# Patient Record
Sex: Male | Born: 1960 | Race: White | Hispanic: No | Marital: Married | Smoking: Never smoker
Health system: Southern US, Community
[De-identification: ages and names within clinical notes are randomized; demographics above are authoritative.]

## PROBLEM LIST (undated history)

## (undated) DIAGNOSIS — J189 Pneumonia, unspecified organism: Secondary | ICD-10-CM

## (undated) DIAGNOSIS — M1990 Unspecified osteoarthritis, unspecified site: Secondary | ICD-10-CM

## (undated) DIAGNOSIS — U071 COVID-19: Secondary | ICD-10-CM

## (undated) DIAGNOSIS — G4730 Sleep apnea, unspecified: Secondary | ICD-10-CM

## (undated) DIAGNOSIS — I700 Atherosclerosis of aorta: Secondary | ICD-10-CM

## (undated) DIAGNOSIS — I444 Left anterior fascicular block: Secondary | ICD-10-CM

## (undated) DIAGNOSIS — R0600 Dyspnea, unspecified: Secondary | ICD-10-CM

## (undated) DIAGNOSIS — I2510 Atherosclerotic heart disease of native coronary artery without angina pectoris: Secondary | ICD-10-CM

## (undated) DIAGNOSIS — F909 Attention-deficit hyperactivity disorder, unspecified type: Secondary | ICD-10-CM

## (undated) DIAGNOSIS — I219 Acute myocardial infarction, unspecified: Secondary | ICD-10-CM

## (undated) DIAGNOSIS — E785 Hyperlipidemia, unspecified: Secondary | ICD-10-CM

## (undated) DIAGNOSIS — I10 Essential (primary) hypertension: Secondary | ICD-10-CM

## (undated) DIAGNOSIS — Z9889 Other specified postprocedural states: Secondary | ICD-10-CM

## (undated) DIAGNOSIS — I209 Angina pectoris, unspecified: Secondary | ICD-10-CM

## (undated) DIAGNOSIS — H9190 Unspecified hearing loss, unspecified ear: Secondary | ICD-10-CM

## (undated) DIAGNOSIS — R7303 Prediabetes: Secondary | ICD-10-CM

## (undated) DIAGNOSIS — I25810 Atherosclerosis of coronary artery bypass graft(s) without angina pectoris: Secondary | ICD-10-CM

## (undated) DIAGNOSIS — M1711 Unilateral primary osteoarthritis, right knee: Secondary | ICD-10-CM

## (undated) DIAGNOSIS — Z951 Presence of aortocoronary bypass graft: Secondary | ICD-10-CM

## (undated) DIAGNOSIS — Z7982 Long term (current) use of aspirin: Secondary | ICD-10-CM

## (undated) HISTORY — PX: CARDIAC SURGERY: SHX584

## (undated) HISTORY — PX: ARTHROSCOPIC REPAIR ACL: SUR80

## (undated) HISTORY — PX: CORONARY ANGIOPLASTY WITH STENT PLACEMENT: SHX49

## (undated) HISTORY — PX: COLONOSCOPY W/ POLYPECTOMY: SHX1380

---

## 1999-02-14 DIAGNOSIS — I2510 Atherosclerotic heart disease of native coronary artery without angina pectoris: Secondary | ICD-10-CM

## 1999-02-14 DIAGNOSIS — Z951 Presence of aortocoronary bypass graft: Secondary | ICD-10-CM

## 1999-02-14 HISTORY — PX: CORONARY ARTERY BYPASS GRAFT: SHX141

## 1999-02-14 HISTORY — DX: Atherosclerotic heart disease of native coronary artery without angina pectoris: I25.10

## 1999-02-14 HISTORY — DX: Presence of aortocoronary bypass graft: Z95.1

## 2017-03-09 ENCOUNTER — Encounter: Payer: Self-pay | Admitting: Emergency Medicine

## 2017-03-09 ENCOUNTER — Other Ambulatory Visit: Payer: Self-pay

## 2017-03-09 ENCOUNTER — Emergency Department
Admission: EM | Admit: 2017-03-09 | Discharge: 2017-03-09 | Disposition: A | Discharge status: Home / Self Care | Payer: No Typology Code available for payment source | Attending: Emergency Medicine | Admitting: Emergency Medicine

## 2017-03-09 ENCOUNTER — Emergency Department: Payer: No Typology Code available for payment source

## 2017-03-09 DIAGNOSIS — I2510 Atherosclerotic heart disease of native coronary artery without angina pectoris: Secondary | ICD-10-CM | POA: Insufficient documentation

## 2017-03-09 DIAGNOSIS — I10 Essential (primary) hypertension: Secondary | ICD-10-CM | POA: Insufficient documentation

## 2017-03-09 DIAGNOSIS — K5790 Diverticulosis of intestine, part unspecified, without perforation or abscess without bleeding: Secondary | ICD-10-CM | POA: Diagnosis not present

## 2017-03-09 DIAGNOSIS — K5792 Diverticulitis of intestine, part unspecified, without perforation or abscess without bleeding: Secondary | ICD-10-CM

## 2017-03-09 DIAGNOSIS — R1032 Left lower quadrant pain: Secondary | ICD-10-CM | POA: Diagnosis present

## 2017-03-09 HISTORY — DX: Atherosclerotic heart disease of native coronary artery without angina pectoris: I25.10

## 2017-03-09 HISTORY — DX: Essential (primary) hypertension: I10

## 2017-03-09 LAB — URINALYSIS, COMPLETE (UACMP) WITH MICROSCOPIC
BILIRUBIN URINE: NEGATIVE
Bacteria, UA: NONE SEEN
Glucose, UA: NEGATIVE mg/dL
Hgb urine dipstick: NEGATIVE
KETONES UR: 80 mg/dL — AB
Leukocytes, UA: NEGATIVE
Nitrite: NEGATIVE
PH: 5 (ref 5.0–8.0)
PROTEIN: 30 mg/dL — AB
SQUAMOUS EPITHELIAL / LPF: NONE SEEN
Specific Gravity, Urine: 1.017 (ref 1.005–1.030)

## 2017-03-09 LAB — CBC
HCT: 42.6 % (ref 40.0–52.0)
HEMOGLOBIN: 14.8 g/dL (ref 13.0–18.0)
MCH: 31.2 pg (ref 26.0–34.0)
MCHC: 34.9 g/dL (ref 32.0–36.0)
MCV: 89.5 fL (ref 80.0–100.0)
Platelets: 262 10*3/uL (ref 150–440)
RBC: 4.76 MIL/uL (ref 4.40–5.90)
RDW: 13 % (ref 11.5–14.5)
WBC: 7.2 10*3/uL (ref 3.8–10.6)

## 2017-03-09 LAB — COMPREHENSIVE METABOLIC PANEL
ALBUMIN: 4.6 g/dL (ref 3.5–5.0)
ALK PHOS: 26 U/L — AB (ref 38–126)
ALT: 36 U/L (ref 17–63)
ANION GAP: 14 (ref 5–15)
AST: 25 U/L (ref 15–41)
BUN: 11 mg/dL (ref 6–20)
CALCIUM: 9.9 mg/dL (ref 8.9–10.3)
CHLORIDE: 99 mmol/L — AB (ref 101–111)
CO2: 24 mmol/L (ref 22–32)
Creatinine, Ser: 0.97 mg/dL (ref 0.61–1.24)
GFR calc Af Amer: >60 mL/min (ref >60)
GFR calc non Af Amer: >60 mL/min (ref >60)
GLUCOSE: 79 mg/dL (ref 65–99)
Potassium: 4.1 mmol/L (ref 3.5–5.1)
SODIUM: 137 mmol/L (ref 135–145)
Total Bilirubin: 1.4 mg/dL — ABNORMAL HIGH (ref 0.3–1.2)
Total Protein: 8.1 g/dL (ref 6.5–8.1)

## 2017-03-09 LAB — LIPASE, BLOOD: LIPASE: 53 U/L — AB (ref 11–51)

## 2017-03-09 MED ORDER — HYDROMORPHONE HCL 1 MG/ML IJ SOLN
INTRAMUSCULAR | Status: AC
  Filled 2017-03-09: qty 1

## 2017-03-09 MED ORDER — CIPROFLOXACIN HCL 500 MG PO TABS: 500.0000 mg | ORAL_TABLET | Freq: Two times a day (BID) | ORAL | 0 refills | Status: AC

## 2017-03-09 MED ORDER — HYDROMORPHONE HCL 1 MG/ML IJ SOLN
1.0000 mg | Freq: Once | INTRAMUSCULAR | Status: AC
  Administered 2017-03-09: 1 mg via INTRAVENOUS

## 2017-03-09 MED ORDER — IOPAMIDOL (ISOVUE-300) INJECTION 61%
100.0000 mL | Freq: Once | INTRAVENOUS | Status: AC | PRN
  Administered 2017-03-09: 100 mL via INTRAVENOUS

## 2017-03-09 MED ORDER — IOPAMIDOL (ISOVUE-300) INJECTION 61%
30.0000 mL | Freq: Once | INTRAVENOUS | Status: AC
  Administered 2017-03-09: 30 mL via ORAL

## 2017-03-09 MED ORDER — SODIUM CHLORIDE 0.9 % IV BOLUS (SEPSIS)
1000.0000 mL | Freq: Once | INTRAVENOUS | Status: AC
  Administered 2017-03-09: 1000 mL via INTRAVENOUS

## 2017-03-09 MED ORDER — METRONIDAZOLE 500 MG PO TABS: 500.0000 mg | ORAL_TABLET | Freq: Two times a day (BID) | ORAL | 0 refills | Status: DC

## 2017-03-09 MED ORDER — HYDROMORPHONE HCL 1 MG/ML IJ SOLN
1.0000 mg | Freq: Once | INTRAMUSCULAR | Status: AC
  Administered 2017-03-09: 1 mg via INTRAVENOUS
  Filled 2017-03-09: qty 1

## 2017-03-09 NOTE — Discharge Instructions (Signed)
Please take the entire course of antibiotics, even if you are feeling better.  You may take Tylenol or Motrin for your pain.  Please follow the instructions for a bland diet.  Return to the emergency department if you develop severe pain, fever, diarrhea or bloody stools, vomiting, or any other symptoms concerning to you.

## 2017-03-09 NOTE — ED Triage Notes (Signed)
First Nurse Note:  Arrives with mother with c/o fever and RLQ abdominal pain x 1 day.  Mom states patient has had similar symptoms in the past that were related to a UTI.  Tylenol last given this morning at 0530.  Patient is AAOx3.  Skin warm and dry. NAD

## 2017-03-09 NOTE — ED Triage Notes (Signed)
First Nurse Note:  C/O left upper quadrant abdominal pain.  States pain was initially more generalized but has become more intense and focused to LUQ.  Symptoms onset 2-3 days.  Denies N/V/D.  AAOx3.  Skin warm and dry.  Posture upright and relaxed. NAD

## 2017-03-09 NOTE — ED Notes (Signed)
Pt alert and oriented X4, active, cooperative, pt in NAD. RR even and unlabored, color WNL.  Pt informed to return if any life threatening symptoms occur.  Discharge and followup instructions reviewed.  

## 2017-03-09 NOTE — ED Notes (Signed)
CT notified that patient has finished all of oral CT contrast.

## 2017-03-09 NOTE — ED Provider Notes (Signed)
[facility] Emergency Department Provider Note  ____________________________________________  Time seen: Approximately 10:07 AM  I have reviewed the triage vital signs and the nursing notes.   HISTORY  Chief Complaint Abdominal Pain    HPI Curtis Cooke is a 57 y.o. male with no prior history of abdominal surgeries, history of HTN and CAD, presenting with left lower quadrant pain.  The patient reports that 3 days ago he had a mild and diffuse pain in the entire middle section of the abdomen.  Since then, the pain has localized to the left lower quadrant.  His last bowel movement was yesterday and was small but normal.  He has not had any nausea or vomiting, fever or chills, testicular or scrotal pain, discharge from the penis, dysuria or urinary frequency.  Past Medical History:  Diagnosis Date  . Coronary artery disease   . Hypertension     There are no active problems to display for this patient.   Past Surgical History:  Procedure Laterality Date  . CARDIAC SURGERY        Allergies Patient has no known allergies.  No family history on file.  Social History Social History   Tobacco Use  . Smoking status: Never Smoker  . Smokeless tobacco: Never Used  Substance Use Topics  . Alcohol use: Yes  . Drug use: No    Review of Systems Constitutional: No fever/chills.  No lightheadedness or syncope.  Eyes: No visual changes. ENT: No sore throat. No congestion or rhinorrhea. Cardiovascular: Denies chest pain. Denies palpitations. Respiratory: Denies shortness of breath.  No cough. Gastrointestinal: Positive left lower quadrant abdominal pain.  No nausea, no vomiting.  No diarrhea.  No constipation. Genitourinary: Negative for dysuria. Musculoskeletal: Negative for back pain. Skin: Negative for rash. Neurological: Negative for headaches. No focal numbness, tingling or weakness.     ____________________________________________   PHYSICAL  EXAM:  VITAL SIGNS: ED Triage Vitals [03/09/17 0853]  Enc Vitals Group     BP (!) 141/103     Pulse Rate 80     Resp 20     Temp 97.6 F (36.4 C)     Temp Source Oral     SpO2 100 %     Weight 200 lb (90.7 kg)     Height 5\' 7"  (1.702 m)     Head Circumference      Peak Flow      Pain Score 6     Pain Loc      Pain Edu?      Excl. in GC?     Constitutional: Alert and oriented. Well appearing and in no acute distress. Answers questions appropriately. Eyes: Conjunctivae are normal.  EOMI. No scleral icterus. Head: Atraumatic. Nose: No congestion/rhinnorhea. Mouth/Throat: Mucous membranes are moist.  Neck: No stridor.  Supple.  No JVD.  No meningismus. Cardiovascular: Normal rate, regular rhythm. No murmurs, rubs or gallops.  Respiratory: Normal respiratory effort.  No accessory muscle use or retractions. Lungs CTAB.  No wheezes, rales or ronchi. Gastrointestinal: Soft, and nondistended.  Tender to palpation in the left lower quadrant.  No guarding or rebound.  No peritoneal signs. Musculoskeletal: No LE edema. No ttp in the calves or palpable cords.  Negative Homan's sign. Neurologic:  A&Ox3.  Speech is clear.  Face and smile are symmetric.  EOMI.  Moves all extremities well. Skin:  Skin is warm, dry and intact. No rash noted. Psychiatric: Mood and affect are normal. Speech and behavior are normal.  Normal judgement.  ____________________________________________   LABS (all labs ordered are listed, but only abnormal results are displayed)  Labs Reviewed  LIPASE, BLOOD - Abnormal; Notable for the following components:      Result Value   Lipase 53 (*)    All other components within normal limits  COMPREHENSIVE METABOLIC PANEL - Abnormal; Notable for the following components:   Chloride 99 (*)    Alkaline Phosphatase 26 (*)    Total Bilirubin 1.4 (*)    All other components within normal limits  URINALYSIS, COMPLETE (UACMP) WITH MICROSCOPIC - Abnormal; Notable for the  following components:   Color, Urine YELLOW (*)    APPearance HAZY (*)    Ketones, ur 80 (*)    Protein, ur 30 (*)    All other components within normal limits  CBC   ____________________________________________  EKG  ED ECG REPORT I, Rockne Menghini, the attending physician, personally viewed and interpreted this ECG.   Date: 03/09/2017  EKG Time: 905  Rate: 72  Rhythm: normal sinus rhythm  Axis: normal  Intervals:none  ST&T Change: No STEMI  ____________________________________________  RADIOLOGY  Ct Abdomen Pelvis W Contrast  Result Date: 03/09/2017 CLINICAL DATA:  Left lower quadrant pain for 2 days EXAM: CT ABDOMEN AND PELVIS WITH CONTRAST TECHNIQUE: Multidetector CT imaging of the abdomen and pelvis was performed using the standard protocol following bolus administration of intravenous contrast. CONTRAST:  ISOVUE-300 IOPAMIDOL (ISOVUE-300) INJECTION 61% COMPARISON:  None. FINDINGS: Lower chest: No acute abnormality. Hepatobiliary: No focal liver abnormality is seen. No gallstones, gallbladder wall thickening, or biliary dilatation. Pancreas: Unremarkable. No pancreatic ductal dilatation or surrounding inflammatory changes. Spleen: Normal in size without focal abnormality. Adrenals/Urinary Tract: Adrenal glands are unremarkable. Kidneys are normal, without renal calculi, focal lesion, or hydronephrosis. Bladder is unremarkable. Stomach/Bowel: The appendix is within normal limits. Mild diverticulosis is identified. Some very mild pericolonic inflammatory changes noted in the left lower quadrant consistent with early diverticulitis. No perforation or abscess formation is identified. Vascular/Lymphatic: Aortic atherosclerosis. No enlarged abdominal or pelvic lymph nodes. Reproductive: Prostate is unremarkable. Other: No abdominal wall hernia or abnormality. No abdominopelvic ascites. Musculoskeletal: No acute or significant osseous findings. IMPRESSION: Changes of very mild  diverticulitis in the descending colon. Electronically Signed   By: Alcide Clever M.D.   On: 03/09/2017 12:39    ____________________________________________   PROCEDURES  Procedure(s) performed: None  Procedures  Critical Care performed: No ____________________________________________   INITIAL IMPRESSION / ASSESSMENT AND PLAN / ED COURSE  Pertinent labs & imaging results that were available during my care of the patient were reviewed by me and considered in my medical decision making (see chart for details).  57 y.o. male without any history of prior abdominal surgeries or pathology, presenting with left lower quadrant pain without any red flag symptoms.  Overall, the patient is mildly hypertensive but otherwise hemodynamically stable and afebrile.  His abdominal examination does show left lower quadrant pain, and I am concerned about diverticulitis.  A CT scan has been ordered.  Aortic pathology is very unlikely.  Will check a urine, but UTI is also unlikely given the lack of urinary symptoms.  Plan to initiate symptom medic treatment and reevaluate the patient for final disposition.  ----------------------------------------- 12:47 PM on 03/09/2017 -----------------------------------------  The patient's clinical picture and CT scan is consistent with uncomplicated diverticulitis.  I will plan to treat the patient with ciprofloxacin and Flagyl at home, and have him follow-up with his primary care physician.  We  discussed follow-up instructions as well as return precautions.  ____________________________________________  FINAL CLINICAL IMPRESSION(S) / ED DIAGNOSES  Final diagnoses:  Diverticulitis         NEW MEDICATIONS STARTED DURING THIS VISIT:  New Prescriptions   CIPROFLOXACIN (CIPRO) 500 MG TABLET    Take 1 tablet (500 mg total) by mouth 2 (two) times daily for 7 days.   METRONIDAZOLE (FLAGYL) 500 MG TABLET    Take 1 tablet (500 mg total) by mouth 2 (two) times  daily.      Rockne Menghini, MD 03/09/17 1247

## 2017-03-09 NOTE — ED Triage Notes (Addendum)
Pt c/o LLQ abdominal pain towards the side, states he took Tylenol PM which helped some. Pt states eating and drinking can make it worse. Pt states the pain is intermittent and sharp.  Pt has no prior hx of kidney stone or similar pain.  Pt denies any back pain. Pain is 6/10. Sxs started 2 mornings ago.  Denies any N/V/D.  Denies dysuria.

## 2019-02-17 IMAGING — CT CT ABD-PELV W/ CM
2 of 5 series · 16 of 46 positions shown, 18 images · IV contrast (APPLIED)
Comparison: None.

CLINICAL DATA: Left lower quadrant pain for 2 days

EXAM:
CT ABDOMEN AND PELVIS WITH CONTRAST
TECHNIQUE: Multidetector CT imaging of the abdomen and pelvis was performed
using the standard protocol following bolus administration of
intravenous contrast.
CONTRAST:  100mL LTK8IF-KUU IOPAMIDOL (LTK8IF-KUU) INJECTION 61%

[Series 2: routine abd/pel with · axial · 0.92mm/px · z∈[-1015,-560]mm · 13 of 103 slices shown, 15 images]
[im 6/103  soft-tissue]
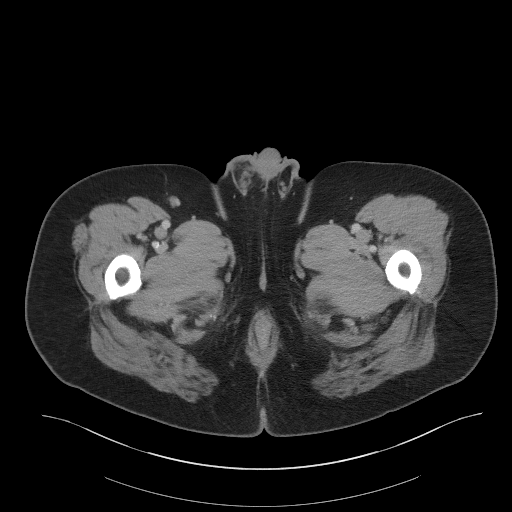
[im 6/103  bone]
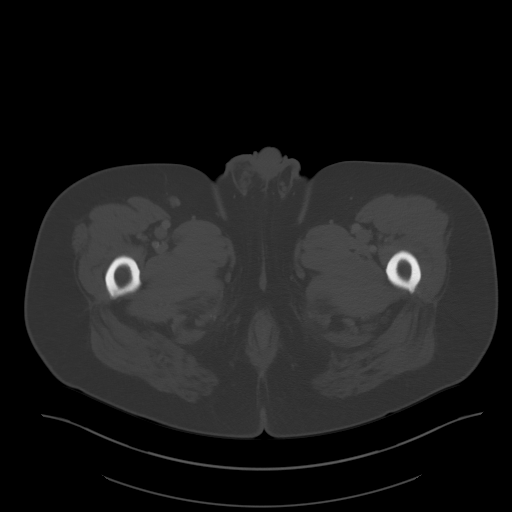
[im 17/103  soft-tissue]
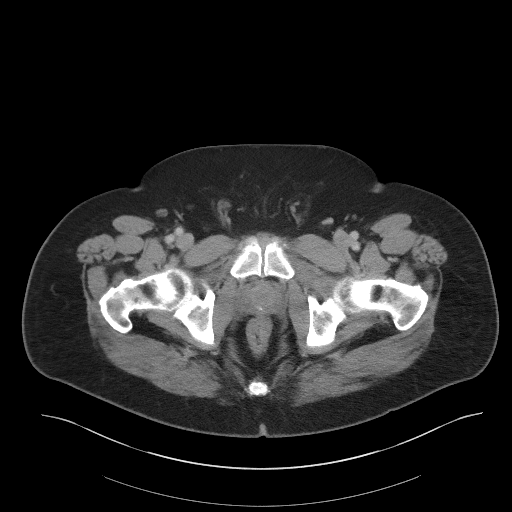
[im 22/103  soft-tissue]
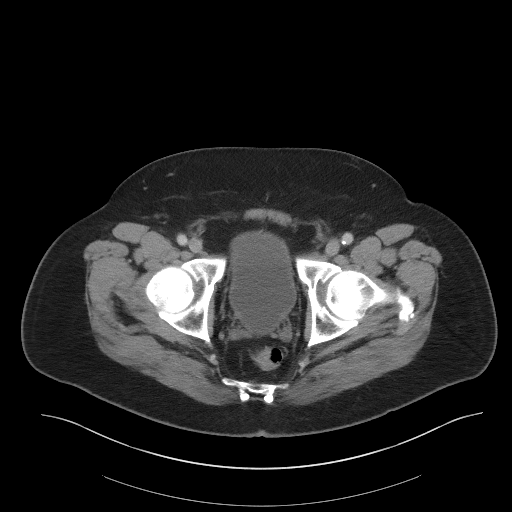
[im 27/103  soft-tissue]
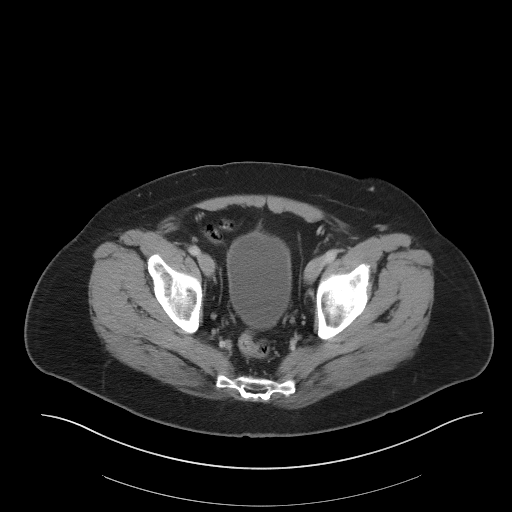
[im 38/103  soft-tissue]
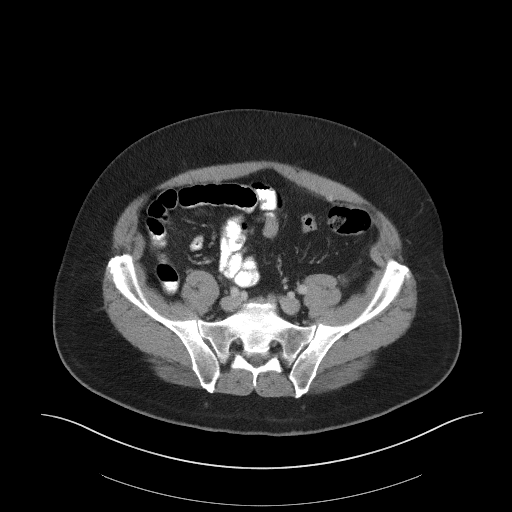
[im 43/103  soft-tissue]
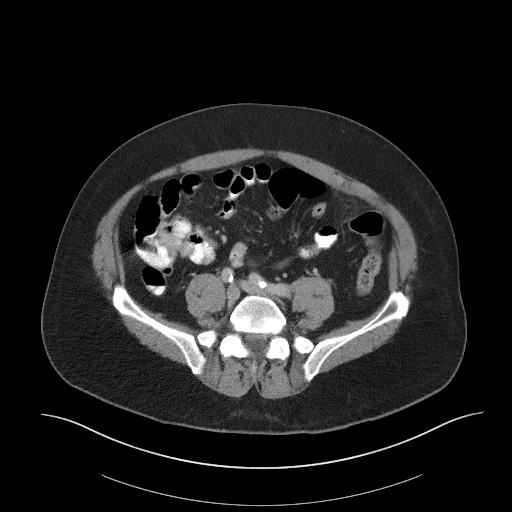
[im 54/103  soft-tissue]
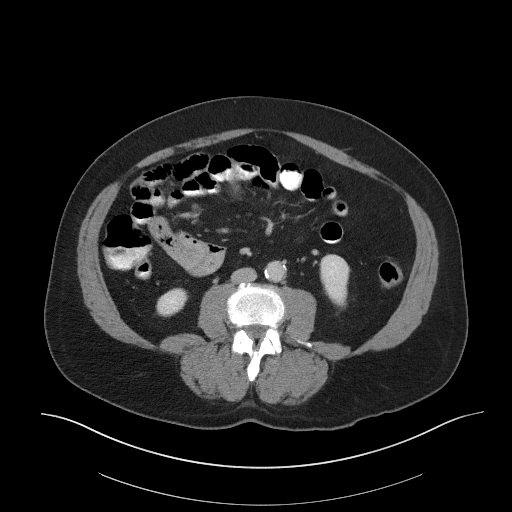
[im 60/103  soft-tissue]
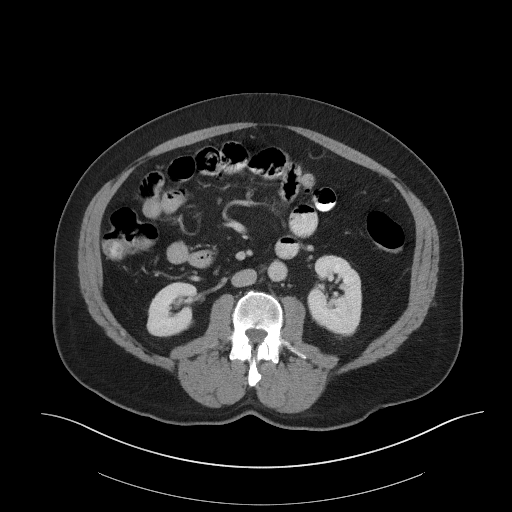
[im 65/103  soft-tissue]
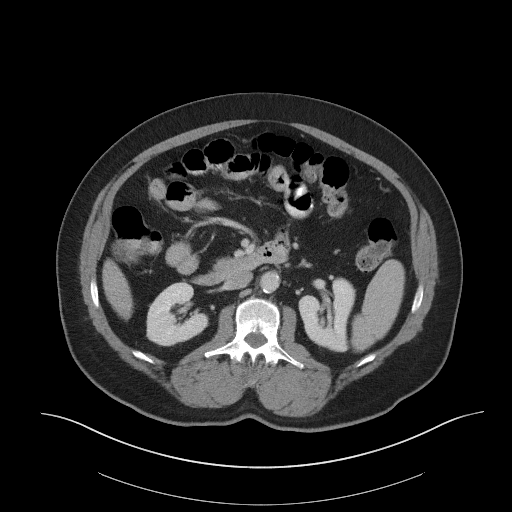
[im 65/103  bone]
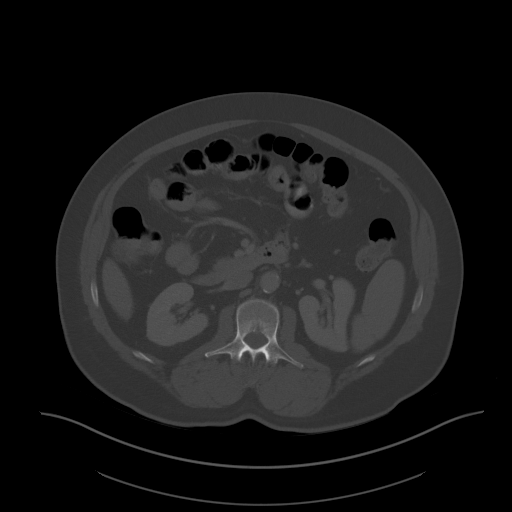
[im 76/103  soft-tissue]
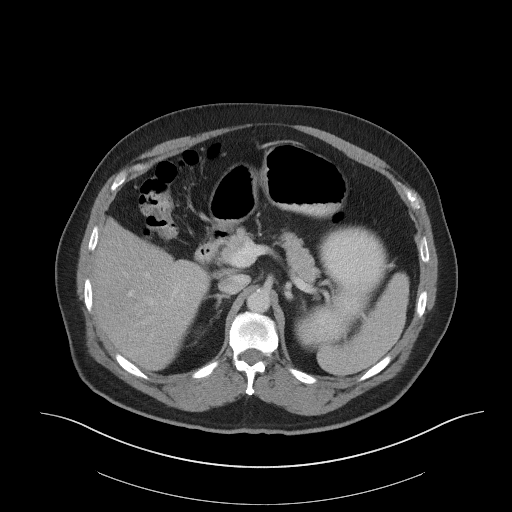
[im 81/103  soft-tissue]
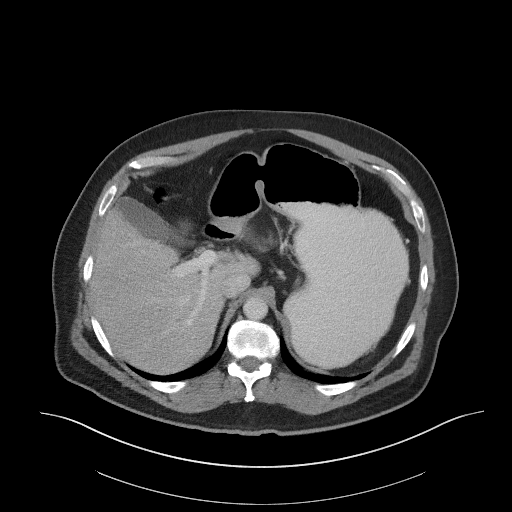
[im 86/103  soft-tissue]
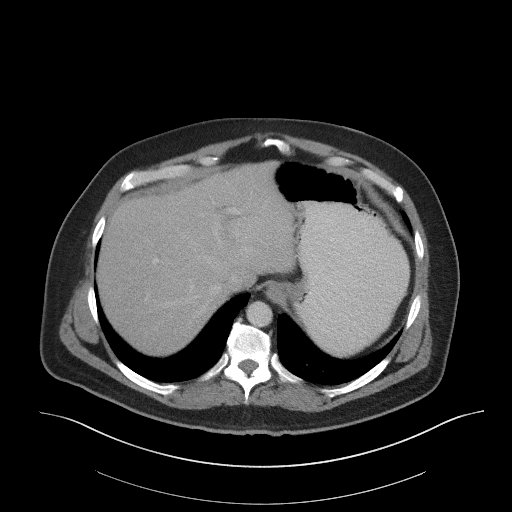
[im 97/103  soft-tissue]
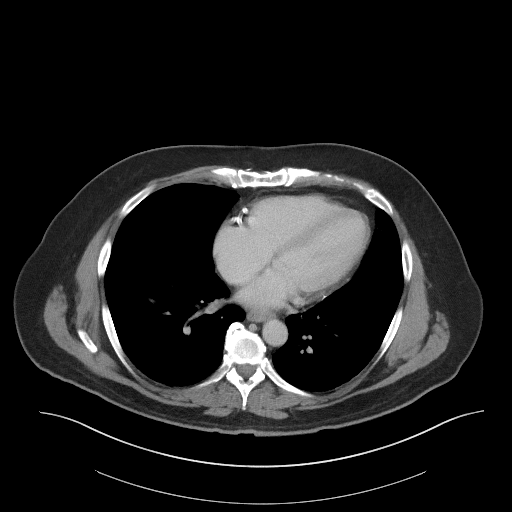

[Series 5: coronal st · coronal · 0.78mm/px · 3 of 101 slices shown]
[im 34/101  soft-tissue]
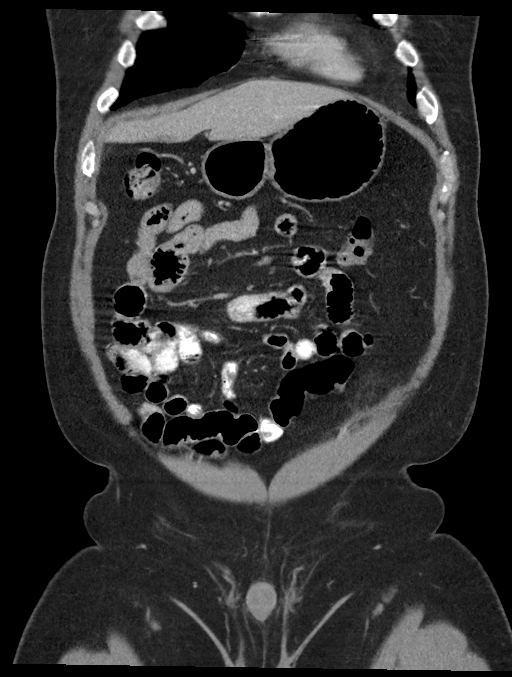
[im 45/101  soft-tissue]
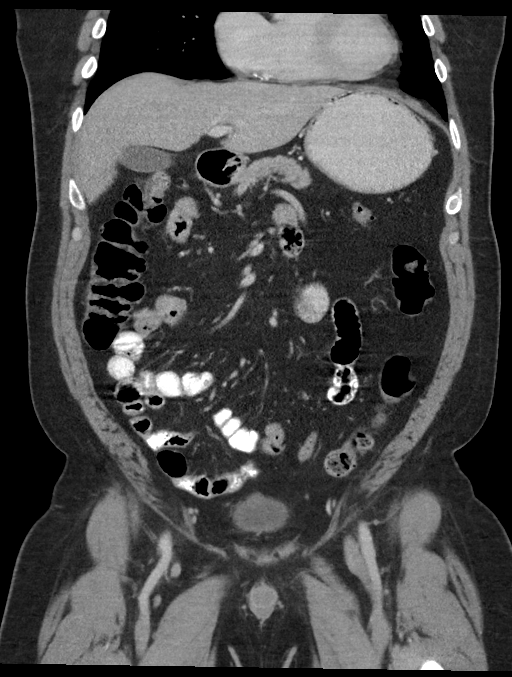
[im 56/101  soft-tissue]
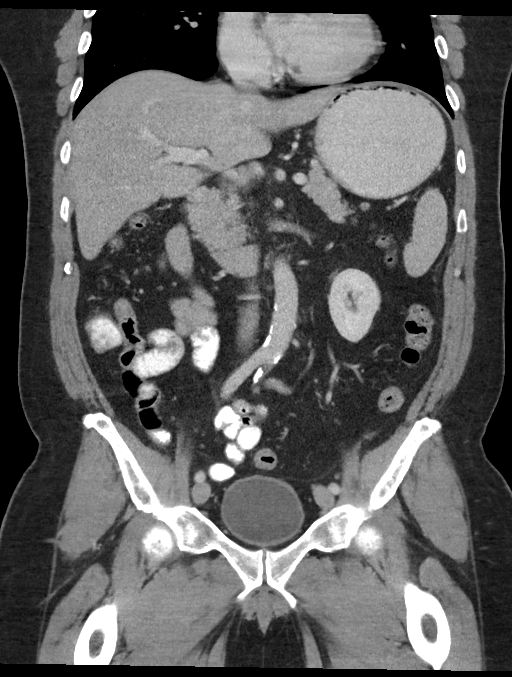

[16 of 46 positions shown; findings below may reference images not displayed]

FINDINGS: Lower chest: No acute abnormality.

Hepatobiliary: No focal liver abnormality is seen. No gallstones,
gallbladder wall thickening, or biliary dilatation.

Pancreas: Unremarkable. No pancreatic ductal dilatation or
surrounding inflammatory changes.

Spleen: Normal in size without focal abnormality.

Adrenals/Urinary Tract: Adrenal glands are unremarkable. Kidneys are
normal, without renal calculi, focal lesion, or hydronephrosis.
Bladder is unremarkable.

Stomach/Bowel: The appendix is within normal limits. Mild
diverticulosis is identified. Some very mild pericolonic
inflammatory changes noted in the left lower quadrant consistent
with early diverticulitis. No perforation or abscess formation is
identified.

Vascular/Lymphatic: Aortic atherosclerosis. No enlarged abdominal or
pelvic lymph nodes.

Reproductive: Prostate is unremarkable.

Other: No abdominal wall hernia or abnormality. No abdominopelvic
ascites.

Musculoskeletal: No acute or significant osseous findings.
IMPRESSION: Changes of very mild diverticulitis in the descending colon.

## 2019-09-17 ENCOUNTER — Emergency Department: Payer: 59

## 2019-09-17 ENCOUNTER — Other Ambulatory Visit: Payer: Self-pay

## 2019-09-17 ENCOUNTER — Encounter: Payer: Self-pay | Admitting: Emergency Medicine

## 2019-09-17 ENCOUNTER — Inpatient Hospital Stay
Admission: EM | Admit: 2019-09-17 | Discharge: 2019-09-19 | DRG: 247 | Disposition: A | Discharge status: Home / Self Care | Payer: 59 | Attending: Hospitalist | Admitting: Hospitalist

## 2019-09-17 DIAGNOSIS — Z79899 Other long term (current) drug therapy: Secondary | ICD-10-CM | POA: Diagnosis not present

## 2019-09-17 DIAGNOSIS — I25810 Atherosclerosis of coronary artery bypass graft(s) without angina pectoris: Secondary | ICD-10-CM | POA: Diagnosis present

## 2019-09-17 DIAGNOSIS — Z8249 Family history of ischemic heart disease and other diseases of the circulatory system: Secondary | ICD-10-CM | POA: Diagnosis not present

## 2019-09-17 DIAGNOSIS — R519 Headache, unspecified: Secondary | ICD-10-CM | POA: Diagnosis present

## 2019-09-17 DIAGNOSIS — I10 Essential (primary) hypertension: Secondary | ICD-10-CM | POA: Diagnosis present

## 2019-09-17 DIAGNOSIS — E7800 Pure hypercholesterolemia, unspecified: Secondary | ICD-10-CM | POA: Diagnosis present

## 2019-09-17 DIAGNOSIS — R079 Chest pain, unspecified: Secondary | ICD-10-CM | POA: Diagnosis present

## 2019-09-17 DIAGNOSIS — Z20822 Contact with and (suspected) exposure to covid-19: Secondary | ICD-10-CM | POA: Diagnosis present

## 2019-09-17 DIAGNOSIS — I214 Non-ST elevation (NSTEMI) myocardial infarction: Secondary | ICD-10-CM

## 2019-09-17 DIAGNOSIS — Z955 Presence of coronary angioplasty implant and graft: Secondary | ICD-10-CM | POA: Diagnosis not present

## 2019-09-17 DIAGNOSIS — I959 Hypotension, unspecified: Secondary | ICD-10-CM | POA: Diagnosis not present

## 2019-09-17 DIAGNOSIS — E785 Hyperlipidemia, unspecified: Secondary | ICD-10-CM | POA: Diagnosis present

## 2019-09-17 DIAGNOSIS — I2510 Atherosclerotic heart disease of native coronary artery without angina pectoris: Secondary | ICD-10-CM | POA: Diagnosis present

## 2019-09-17 DIAGNOSIS — Z7982 Long term (current) use of aspirin: Secondary | ICD-10-CM

## 2019-09-17 HISTORY — DX: Chest pain, unspecified: R07.9

## 2019-09-17 HISTORY — DX: Non-ST elevation (NSTEMI) myocardial infarction: I21.4

## 2019-09-17 LAB — CBC WITH DIFFERENTIAL/PLATELET
Abs Immature Granulocytes: 0.03 10*3/uL (ref 0.00–0.07)
Basophils Absolute: 0.1 10*3/uL (ref 0.0–0.1)
Basophils Relative: 1 %
Eosinophils Absolute: 0.2 10*3/uL (ref 0.0–0.5)
Eosinophils Relative: 3 %
HCT: 41.4 % (ref 39.0–52.0)
Hemoglobin: 14.8 g/dL (ref 13.0–17.0)
Immature Granulocytes: 0 %
Lymphocytes Relative: 35 %
Lymphs Abs: 2.7 10*3/uL (ref 0.7–4.0)
MCH: 30.5 pg (ref 26.0–34.0)
MCHC: 35.7 g/dL (ref 30.0–36.0)
MCV: 85.4 fL (ref 80.0–100.0)
Monocytes Absolute: 0.8 10*3/uL (ref 0.1–1.0)
Monocytes Relative: 10 %
Neutro Abs: 4 10*3/uL (ref 1.7–7.7)
Neutrophils Relative %: 51 %
Platelets: 252 10*3/uL (ref 150–400)
RBC: 4.85 MIL/uL (ref 4.22–5.81)
RDW: 12.1 % (ref 11.5–15.5)
WBC: 7.8 10*3/uL (ref 4.0–10.5)
nRBC: 0 % (ref 0.0–0.2)

## 2019-09-17 LAB — COMPREHENSIVE METABOLIC PANEL
ALT: 25 U/L (ref 0–44)
AST: 20 U/L (ref 15–41)
Albumin: 4.6 g/dL (ref 3.5–5.0)
Alkaline Phosphatase: 34 U/L — ABNORMAL LOW (ref 38–126)
Anion gap: 11 (ref 5–15)
BUN: 19 mg/dL (ref 6–20)
CO2: 24 mmol/L (ref 22–32)
Calcium: 9 mg/dL (ref 8.9–10.3)
Chloride: 103 mmol/L (ref 98–111)
Creatinine, Ser: 0.82 mg/dL (ref 0.61–1.24)
GFR calc Af Amer: >60 mL/min (ref >60)
GFR calc non Af Amer: >60 mL/min (ref >60)
Glucose, Bld: 120 mg/dL — ABNORMAL HIGH (ref 70–99)
Potassium: 3.6 mmol/L (ref 3.5–5.1)
Sodium: 138 mmol/L (ref 135–145)
Total Bilirubin: 0.8 mg/dL (ref 0.3–1.2)
Total Protein: 7.6 g/dL (ref 6.5–8.1)

## 2019-09-17 LAB — LIPID PANEL
Cholesterol: 208 mg/dL — ABNORMAL HIGH (ref 0–200)
HDL: 30 mg/dL — ABNORMAL LOW (ref >40)
LDL Cholesterol: 145 mg/dL — ABNORMAL HIGH (ref 0–99)
Total CHOL/HDL Ratio: 6.9 RATIO
Triglycerides: 167 mg/dL — ABNORMAL HIGH (ref <150)
VLDL: 33 mg/dL (ref 0–40)

## 2019-09-17 LAB — TROPONIN I (HIGH SENSITIVITY)
Troponin I (High Sensitivity): 7 ng/L (ref <18)
Troponin I (High Sensitivity): 474 ng/L (ref <18)
Troponin I (High Sensitivity): 978 ng/L (ref <18)
Troponin I (High Sensitivity): 1053 ng/L (ref <18)

## 2019-09-17 LAB — TSH: TSH: 1.855 u[IU]/mL (ref 0.350–4.500)

## 2019-09-17 LAB — HEMOGLOBIN A1C
Hgb A1c MFr Bld: 5.4 % (ref 4.8–5.6)
Mean Plasma Glucose: 108.28 mg/dL

## 2019-09-17 LAB — APTT: aPTT: 31 seconds (ref 24–36)

## 2019-09-17 LAB — PROTIME-INR
INR: 0.9 (ref 0.8–1.2)
Prothrombin Time: 12.1 seconds (ref 11.4–15.2)

## 2019-09-17 LAB — HEPARIN LEVEL (UNFRACTIONATED): Heparin Unfractionated: 0.17 IU/mL — ABNORMAL LOW (ref 0.30–0.70)

## 2019-09-17 LAB — SARS CORONAVIRUS 2 BY RT PCR (HOSPITAL ORDER, PERFORMED IN ~~LOC~~ HOSPITAL LAB): SARS Coronavirus 2: NEGATIVE

## 2019-09-17 MED ORDER — IOHEXOL 350 MG/ML SOLN
100.0000 mL | Freq: Once | INTRAVENOUS | Status: AC | PRN
  Administered 2019-09-17: 100 mL via INTRAVENOUS

## 2019-09-17 MED ORDER — SODIUM CHLORIDE 0.9% FLUSH: 3.0000 mL | INTRAVENOUS | Status: DC | PRN

## 2019-09-17 MED ORDER — RAMIPRIL 10 MG PO CAPS
10.0000 mg | ORAL_CAPSULE | Freq: Every day | ORAL | Status: DC
  Administered 2019-09-17 – 2019-09-19 (×2): 10 mg via ORAL
  Filled 2019-09-17 (×3): qty 1

## 2019-09-17 MED ORDER — ATORVASTATIN CALCIUM 80 MG PO TABS
80.0000 mg | ORAL_TABLET | Freq: Every day | ORAL | Status: DC
  Administered 2019-09-18 – 2019-09-19 (×2): 80 mg via ORAL
  Filled 2019-09-17 (×2): qty 1

## 2019-09-17 MED ORDER — AMLODIPINE BESYLATE 5 MG PO TABS: 5.0000 mg | ORAL_TABLET | Freq: Once | ORAL | Status: DC

## 2019-09-17 MED ORDER — SODIUM CHLORIDE 0.9 % WEIGHT BASED INFUSION
1.0000 mL/kg/h | INTRAVENOUS | Status: DC
  Administered 2019-09-18 (×2): 1 mL/kg/h via INTRAVENOUS

## 2019-09-17 MED ORDER — ASPIRIN EC 81 MG PO TBEC: 81.0000 mg | DELAYED_RELEASE_TABLET | Freq: Every day | ORAL | Status: DC

## 2019-09-17 MED ORDER — HEPARIN BOLUS VIA INFUSION
2500.0000 [IU] | Freq: Once | INTRAVENOUS | Status: AC
  Administered 2019-09-17: 2500 [IU] via INTRAVENOUS
  Filled 2019-09-17: qty 2500

## 2019-09-17 MED ORDER — ASPIRIN 81 MG PO CHEW
81.0000 mg | CHEWABLE_TABLET | ORAL | Status: AC
  Administered 2019-09-18: 81 mg via ORAL
  Filled 2019-09-17: qty 1

## 2019-09-17 MED ORDER — SODIUM CHLORIDE 0.9% FLUSH
3.0000 mL | Freq: Two times a day (BID) | INTRAVENOUS | Status: DC
  Administered 2019-09-17 – 2019-09-19 (×3): 3 mL via INTRAVENOUS

## 2019-09-17 MED ORDER — SODIUM CHLORIDE 0.9 % WEIGHT BASED INFUSION
3.0000 mL/kg/h | INTRAVENOUS | Status: AC
  Administered 2019-09-18: 3 mL/kg/h via INTRAVENOUS

## 2019-09-17 MED ORDER — HEPARIN (PORCINE) 25000 UT/250ML-% IV SOLN
1250.0000 [IU]/h | INTRAVENOUS | Status: DC
  Administered 2019-09-17: 1000 [IU]/h via INTRAVENOUS
  Administered 2019-09-18: 1250 [IU]/h via INTRAVENOUS
  Filled 2019-09-17 (×2): qty 250

## 2019-09-17 MED ORDER — METOPROLOL TARTRATE 25 MG PO TABS
12.5000 mg | ORAL_TABLET | Freq: Two times a day (BID) | ORAL | Status: DC
  Administered 2019-09-17 (×2): 12.5 mg via ORAL
  Filled 2019-09-17 (×2): qty 1

## 2019-09-17 MED ORDER — SODIUM CHLORIDE 0.9 % IV SOLN: 250.0000 mL | INTRAVENOUS | Status: DC | PRN

## 2019-09-17 MED ORDER — HEPARIN BOLUS VIA INFUSION
4000.0000 [IU] | Freq: Once | INTRAVENOUS | Status: AC
  Administered 2019-09-17: 4000 [IU] via INTRAVENOUS
  Filled 2019-09-17: qty 4000

## 2019-09-17 NOTE — H&P (Signed)
History and Physical    Courvoisier Hamblen PJA:[id number] DOB: 1960-03-23 DOA: 09/17/2019  PCP: Marisue Ivan, MD  Patient coming from: home  I have personally briefly reviewed patient's old medical records in [facility]  Chief Complaint: chest pain  HPI: Curtis Cooke is a 59 y.o. male with medical history significant of CAD s/p 5-vessel CABG in 2001, HTN who presented with left-sided chest pain.   Pt reported onset of left-sided chest pain around 5:30 am this morning, pressure-like, with radiation to his left arm, relieved with ASA and nitro.  Pt had 5-vessel CABG back in 2001 and has not had chest pain since then, until this morning.  Prior to this morning, pt was doing well with no complaints.  No dyspnea or swelling.  No hx of CHF.  Last saw cardiologist about 4 years ago, but follows with PCP regularly.    ED Course: initial vitals: afebrile, pulse 67, BP 178/96, sating 100% on room air.  Labs notable for trop 7 then uptrended to 474.  EKG without ACS-related changes.  Pt was started on heparin gtt in the ED, and cardiology consulted prior to admission.   Assessment/Plan Active Problems:   Left-sided chest pain  # Left-sided chest pain 2/2 NSTEMI --trop 7 then uptrended to 474.  Chest pain relieved with nitro.  Currently chest pain-free and HDS. --started on heparin gtt in the ED. --Cardiology consulted PLAN: --Likely left heart cath tomorrow. --continue heparin gtt --trend trop --check TSH, A1c and lipid panel  # Hx of CAD s/p 5-vessel CABG in 2001 --Has had no issues until this morning. PLAN: --continue home metop 12.5 BID --continue home ramipril --continue home ASA 81 and Lipitor 80mg  daily  # HTN --continue home metop 12.5 BID --continue home ramipril   DVT prophylaxis: gtt Code Status: Full code  Family Communication: wife updated at bedside  Disposition Plan: home  Consults called: cardiology Admission status: Inpatient   Review of Systems:  As per HPI otherwise 10 point review of systems negative.   Past Medical History:  Diagnosis Date  . Coronary artery disease   . Hypertension     Past Surgical History:  Procedure Laterality Date  . CARDIAC SURGERY       reports that he has never smoked. He has never used smokeless tobacco. He reports current alcohol use. He reports that he does not use drugs.  No Known Allergies  Family History  Problem Relation Age of Onset  . Atrial fibrillation Mother   . Hypertension Mother   . CAD Father      Prior to Admission medications   Medication Sig Start Date End Date Taking? Authorizing Provider  aspirin EC 81 MG tablet Take 81 mg by mouth daily.    [provider]  atorvastatin (LIPITOR) 80 MG tablet Take 80 mg by mouth daily.    [provider]  Cholecalciferol 50 MCG (2000 UT) CAPS Take 2,000 Units by mouth daily.    [provider]  co-enzyme Q-10 30 MG capsule Take 300 mg by mouth daily.    [provider]  magnesium gluconate (MAGONATE) 500 MG tablet Take 500 mg by mouth 2 (two) times daily.    [provider]  metoprolol tartrate (LOPRESSOR) 25 MG tablet Take 12.5 mg by mouth 2 (two) times daily.    [provider]  ramipril (ALTACE) 10 MG capsule Take 10 mg by mouth daily.    [provider]    Physical Exam: Vitals:  09/17/19 0642 09/17/19 0645 09/17/19 1139 09/17/19 1330  BP:  (!) 178/96 (!) 172/102 (!) 149/127  Pulse:  67 69 64  Resp:  18 11 15   Temp:  98 F (36.7 C)    TempSrc:  Oral    SpO2:  100% 100% 97%  Weight: 95.3 kg     Height: 5\' 7"  (1.702 m)       Constitutional: NAD, AAOx3 HEENT: conjunctivae and lids normal, EOMI CV: RRR no M,R,G. Distal pulses +2.  No cyanosis.   RESP: CTA B/L, normal respiratory effort  GI: +BS, NTND Extremities: No effusions, edema, or tenderness in BLE SKIN: warm, dry and intact Neuro: II - XII grossly intact.  Sensation intact Psych: Normal mood and  affect.  Appropriate judgement and reason   Labs on Admission: I have personally reviewed following labs and imaging studies  CBC: Recent Labs  Lab 09/17/19 0647  WBC 7.8  NEUTROABS 4.0  HGB 14.8  HCT 41.4  MCV 85.4  PLT 252   Basic Metabolic Panel: Recent Labs  Lab 09/17/19 0647  NA 138  K 3.6  CL 103  CO2 24  GLUCOSE 120*  BUN 19  CREATININE 0.82  CALCIUM 9.0   GFR: Estimated Creatinine Clearance: 105.4 mL/min (by C-G formula based on SCr of 0.82 mg/dL). Liver Function Tests: Recent Labs  Lab 09/17/19 0647  AST 20  ALT 25  ALKPHOS 34*  BILITOT 0.8  PROT 7.6  ALBUMIN 4.6   No results for input(s): LIPASE, AMYLASE in the last 168 hours. No results for input(s): AMMONIA in the last 168 hours. Coagulation Profile: Recent Labs  Lab 09/17/19 1309  INR 0.9   Cardiac Enzymes: No results for input(s): CKTOTAL, CKMB, CKMBINDEX, TROPONINI in the last 168 hours. BNP (last 3 results) No results for input(s): PROBNP in the last 8760 hours. HbA1C: No results for input(s): HGBA1C in the last 72 hours. CBG: No results for input(s): GLUCAP in the last 168 hours. Lipid Profile: No results for input(s): CHOL, HDL, LDLCALC, TRIG, CHOLHDL, LDLDIRECT in the last 72 hours. Thyroid Function Tests: No results for input(s): TSH, T4TOTAL, FREET4, T3FREE, THYROIDAB in the last 72 hours. Anemia Panel: No results for input(s): VITAMINB12, FOLATE, FERRITIN, TIBC, IRON, RETICCTPCT in the last 72 hours. Urine analysis:    Component Value Date/Time   COLORURINE YELLOW (A) 03/09/2017 0858   APPEARANCEUR HAZY (A) 03/09/2017 0858   LABSPEC 1.017 03/09/2017 0858   PHURINE 5.0 03/09/2017 0858   GLUCOSEU NEGATIVE 03/09/2017 0858   HGBUR NEGATIVE 03/09/2017 0858   BILIRUBINUR NEGATIVE 03/09/2017 0858   KETONESUR 80 (A) 03/09/2017 0858   PROTEINUR 30 (A) 03/09/2017 0858   NITRITE NEGATIVE 03/09/2017 0858   LEUKOCYTESUR NEGATIVE 03/09/2017 0858    Radiological Exams on  Admission: DG Chest 2 View  Result Date: 09/17/2019 CLINICAL DATA:  Chest pain EXAM: CHEST - 2 VIEW COMPARISON:  None. FINDINGS: Normal heart size and mediastinal contours. Prior median sternotomy. No acute infiltrate or edema. No effusion or pneumothorax. No acute osseous findings. IMPRESSION: No active cardiopulmonary disease. Electronically Signed   By: 03/11/2017 M.D.   On: 09/17/2019 07:36   CT ANGIO CHEST AORTA W/CM & OR WO/CM  Result Date: 09/17/2019 CLINICAL DATA:  Left chest pain and headache today. EXAM: CT ANGIOGRAPHY CHEST WITH AND WITHOUT CONTRAST TECHNIQUE: Multidetector CT imaging of the chest was performed using the standard protocol before and during bolus administration of intravenous contrast. Multiplanar CT image reconstructions and MIPs were obtained to  evaluate the vascular anatomy. CONTRAST:  100 mL OMNIPAQUE IOHEXOL 350 MG/ML SOLN COMPARISON:  None. FINDINGS: Cardiovascular: Preferential opacification of the thoracic aorta. No evidence of thoracic aortic aneurysm or dissection. Normal heart size. No pericardial effusion. The patient is status post CABG. Calcific aortic and coronary atherosclerosis noted. Mediastinum/Nodes: No enlarged mediastinal, hilar, or axillary lymph nodes. Thyroid gland, trachea, and esophagus demonstrate no significant findings. Lungs/Pleura: Lungs are clear. No pleural effusion or pneumothorax. Upper Abdomen: Negative. Musculoskeletal: No chest wall abnormality. No acute or significant osseous findings. Review of the MIP images confirms the above findings. IMPRESSION: Negative for aortic dissection or aneurysm.  No acute abnormality. Aortic Atherosclerosis (ICD10-I70.0). Electronically Signed   By: Drusilla Kanner M.D.   On: 09/17/2019 12:34      Darlin Priestly MD [facility]  If 7PM-7AM, please contact night-coverage 09/17/2019, 3:54 PM

## 2019-09-17 NOTE — Consult Note (Signed)
ANTICOAGULATION CONSULT NOTE  Pharmacy Consult for Heparin Infusion Indication: chest pain/ACS  No Known Allergies  Patient Measurements: Height: 5\' 7"  (170.2 cm) Weight: 95.3 kg (210 lb) IBW/kg (Calculated) : 66.1 Heparin Dosing Weight: 86.4 kg  Vital Signs: Temp: 98.1 F (36.7 C) (08/04 2031) BP: 151/96 (08/04 2031) Pulse Rate: 84 (08/04 2031)  Labs: Recent Labs    09/17/19 0647 09/17/19 0647 09/17/19 0846 09/17/19 1309 09/17/19 1351 09/17/19 1834 09/17/19 1942  HGB 14.8  --   --   --   --   --   --   HCT 41.4  --   --   --   --   --   --   PLT 252  --   --   --   --   --   --   APTT  --   --   --  31  --   --   --   LABPROT  --   --   --  12.1  --   --   --   INR  --   --   --  0.9  --   --   --   HEPARINUNFRC  --   --   --   --   --   --  0.17*  CREATININE 0.82  --   --   --   --   --   --   TROPONINIHS 7   < > 474*  --  978* 1,053*  --    < > = values in this interval not displayed.    Estimated Creatinine Clearance: 105.4 mL/min (by C-G formula based on SCr of 0.82 mg/dL).   Medications:  No anticoagulation prior to admission per chart  Assessment: Patient is a 59 y/o M with medical history including CAD, hypertension, hyperlipidemia who presented to the ED 8/4 with chief complaint chest pain. Troponin 7 >> 474. Pharmacy has been consulted to initiate heparin infusion for ACS.  Baseline CBC within normal limits. Baseline aPTT and PT-INR pending.   Goal of Therapy:  Heparin level 0.3-0.7 units/ml Monitor platelets by anticoagulation protocol: Yes   Plan:  --8/4 at 1942 HL = 0.17, subtherapeutic. Will order heparin 2500 unit IV bolus x 1 and increase drip rate to 1250 units/hr --Check HL 6 hours after rate change --Daily CBC per protocol  10/4 09/17/2019,8:55 PM

## 2019-09-17 NOTE — Consult Note (Signed)
CARDIOLOGY CONSULT NOTE               Patient ID: Curtis Cooke MRN: [id number] DOB/AGE: 1960/06/04 59 y.o.  Admit date: 09/17/2019 Referring Physician Dr. Willy Eddy  Primary Physician Dr. Burnadette Pop  Primary Cardiologist N/A Reason for Consultation Chest pain, elevated troponin  HPI: Curtis Cooke is a 59 year old male with a past medical history significant for coronary artery disease s/p CABG and PCIs in [place], hypercholesterolemia, and hypertension who presented to the ED on 09/17/19 for an acute onset of chest pain pain, radiating down his left arm, with associated diaphoresis. Workup in the ED has been significant for an ECG revealing sinus rhythm with no obvious acute ischemic changes, high sensitivity troponin 7 and 474 respectively, chest xray negative for acute cardiopulmonary disease, and chest CT negative for a PE or an aortic dissection or aneurysm.   Curtis Cooke has an extensive cardiac history dating back to 2001 where he had bypass surgery with PCIs a few years later.  This was all done in [place] and records are currently unavailable.   09/17/19: [name] is currently chest pain free.  He reports he was in good health until around 5am this morning when he developed a sudden onset of chest pain that lasted longer than his typical anginal symptoms.  He denies shortness of breath, lower extremity swelling, orthopnea, or PND.   Review of systems complete and found to be negative unless listed above     Past Medical History:  Diagnosis Date  . Coronary artery disease   . Hypertension     Past Surgical History:  Procedure Laterality Date  . CARDIAC SURGERY      (Not in a hospital admission)  Social History   Socioeconomic History  . Marital status: Married    Spouse name: Not on file  . Number of children: Not on file  . Years of education: Not on file  . Highest education level: Not on file  Occupational History  . Not on file  Tobacco Use  . Smoking status:  Never Smoker  . Smokeless tobacco: Never Used  Vaping Use  . Vaping Use: Never used  Substance and Sexual Activity  . Alcohol use: Yes  . Drug use: No  . Sexual activity: Not on file  Other Topics Concern  . Not on file  Social History Narrative  . Not on file   Social Determinants of Health   Financial Resource Strain:   . Difficulty of Paying Living Expenses:   Food Insecurity:   . Worried About Programme researcher, broadcasting/film/video in the Last Year:   . Barista in the Last Year:   Transportation Needs:   . Freight forwarder (Medical):   Marland Kitchen Lack of Transportation (Non-Medical):   Physical Activity:   . Days of Exercise per Week:   . Minutes of Exercise per Session:   Stress:   . Feeling of Stress :   Social Connections:   . Frequency of Communication with Friends and Family:   . Frequency of Social Gatherings with Friends and Family:   . Attends Religious Services:   . Active Member of Clubs or Organizations:   . Attends Banker Meetings:   Marland Kitchen Marital Status:   Intimate Partner Violence:   . Fear of Current or Ex-Partner:   . Emotionally Abused:   Marland Kitchen Physically Abused:   . Sexually Abused:     History reviewed. No pertinent family history.  Review of systems complete and found to be negative unless listed above     PHYSICAL EXAM  General: Well developed, well nourished, in no acute distress HEENT:  Normocephalic and atramatic Neck:  No JVD.  Lungs: Clear bilaterally to auscultation and percussion. Heart: HRRR . Normal S1 and S2 without gallops or murmurs.  Abdomen: Bowel sounds are positive, abdomen soft and non-tender  Msk:  Back normal.  Normal strength and tone for age. Extremities: No clubbing, cyanosis or edema.   Neuro: Alert and oriented X 3. Psych:  Good affect, responds appropriately  Labs:   Lab Results  Component Value Date   WBC 7.8 09/17/2019   HGB 14.8 09/17/2019   HCT 41.4 09/17/2019   MCV 85.4 09/17/2019   PLT 252  09/17/2019    Recent Labs  Lab 09/17/19 0647  NA 138  K 3.6  CL 103  CO2 24  BUN 19  CREATININE 0.82  CALCIUM 9.0  PROT 7.6  BILITOT 0.8  ALKPHOS 34*  ALT 25  AST 20  GLUCOSE 120*   No results found for: CKTOTAL, CKMB, CKMBINDEX, TROPONINI No results found for: CHOL No results found for: HDL No results found for: LDLCALC No results found for: TRIG No results found for: CHOLHDL No results found for: LDLDIRECT    Radiology: DG Chest 2 View  Result Date: 09/17/2019 CLINICAL DATA:  Chest pain EXAM: CHEST - 2 VIEW COMPARISON:  None. FINDINGS: Normal heart size and mediastinal contours. Prior median sternotomy. No acute infiltrate or edema. No effusion or pneumothorax. No acute osseous findings. IMPRESSION: No active cardiopulmonary disease. Electronically Signed   By: Marnee Spring M.D.   On: 09/17/2019 07:36   CT ANGIO CHEST AORTA W/CM & OR WO/CM  Result Date: 09/17/2019 CLINICAL DATA:  Left chest pain and headache today. EXAM: CT ANGIOGRAPHY CHEST WITH AND WITHOUT CONTRAST TECHNIQUE: Multidetector CT imaging of the chest was performed using the standard protocol before and during bolus administration of intravenous contrast. Multiplanar CT image reconstructions and MIPs were obtained to evaluate the vascular anatomy. CONTRAST:  100 mL OMNIPAQUE IOHEXOL 350 MG/ML SOLN COMPARISON:  None. FINDINGS: Cardiovascular: Preferential opacification of the thoracic aorta. No evidence of thoracic aortic aneurysm or dissection. Normal heart size. No pericardial effusion. The patient is status post CABG. Calcific aortic and coronary atherosclerosis noted. Mediastinum/Nodes: No enlarged mediastinal, hilar, or axillary lymph nodes. Thyroid gland, trachea, and esophagus demonstrate no significant findings. Lungs/Pleura: Lungs are clear. No pleural effusion or pneumothorax. Upper Abdomen: Negative. Musculoskeletal: No chest wall abnormality. No acute or significant osseous findings. Review of the MIP  images confirms the above findings. IMPRESSION: Negative for aortic dissection or aneurysm.  No acute abnormality. Aortic Atherosclerosis (ICD10-I70.0). Electronically Signed   By: Drusilla Kanner M.D.   On: 09/17/2019 12:34    EKG: Normal sinus rhythm at a ventricular rate of 72bpm; no obvious evidence of acute ischemia   ASSESSMENT AND PLAN:  1.  Chest pain/elevated troponin   -First troponin normal, second troponin mildly elevated at 474  -Will trend troponins, follow symptoms, and make further plan pending results; will remain on heparin at this time   -Nitroglycerin PRN   -Will obtain echocardiogram during admission   -Continue aspirin 81mg , atorvastatin 80mg , and metoprolol 25mg  BID   -Stressed the importance of routine, outpatient cardiology follow up upon discharge   The history, physical exam findings, and plan of care were all discussed with Dr. , and all decision making was made in collaboration.  Signed: Andi Hence PA-C 09/17/2019, 1:23 PM

## 2019-09-17 NOTE — ED Notes (Signed)
Date and time results received: 09/17/19 12:33 PM  (use smartphrase ".now" to insert current time)  Test: Troponin Critical Value: 474  Name of Provider Notified: Dr. Roxan Hockey   Orders Received? Or Actions Taken?: No new orders at this time

## 2019-09-17 NOTE — ED Provider Notes (Signed)
[facility] Emergency Department Provider Note    First MD Initiated Contact with Patient 09/17/19 1106     (approximate)  I have reviewed the triage vital signs and the nursing notes.   HISTORY  Chief Complaint No chief complaint on file.    HPI Curtis Cooke is a 59 y.o. male With significant history of CAD hypertension high cholesterol presents to the ER for midsternal chest pressure lasting roughly 1 to 2 hours with diaphoresis with radiation to his left arm.  Took aspirin this morning as well as nitro and his symptoms have abated.  Is states this is different than previous MI symptoms as the pain was more long-lasting.  Denies any fevers.  No nausea.  No pain ripping or tearing through to his back.  He is currently pain-free.    Past Medical History:  Diagnosis Date  . Coronary artery disease   . Hypertension    History reviewed. No pertinent family history. Past Surgical History:  Procedure Laterality Date  . CARDIAC SURGERY     There are no problems to display for this patient.     Prior to Admission medications   Medication Sig Start Date End Date Taking? Authorizing Provider  aspirin EC 81 MG tablet Take 81 mg by mouth daily.    [provider]  atorvastatin (LIPITOR) 80 MG tablet Take 80 mg by mouth daily.    [provider]  Cholecalciferol (VITAMIN D3) 2000 units capsule Take by mouth daily.    [provider]  co-enzyme Q-10 30 MG capsule Take 300 mg by mouth daily.    [provider]  magnesium gluconate (MAGONATE) 500 MG tablet Take 500 mg by mouth 2 (two) times daily.    [provider]  metoprolol tartrate (LOPRESSOR) 25 MG tablet Take 12.5 mg by mouth 2 (two) times daily.    [provider]  metroNIDAZOLE (FLAGYL) 500 MG tablet Take 1 tablet (500 mg total) by mouth 2 (two) times daily. 03/09/17   Rockne MenghiniNorman, Anne-Caroline, MD  Misc Natural Products (APPLE CIDER VINEGAR DIET PO) Take  by mouth daily.    [provider]  Misc Natural Products (GINSENG-AMERICAN PO) Take by mouth daily.    [provider]  Multiple Vitamin (MULTIVITAMIN) tablet Take 1 tablet by mouth daily.    [provider]  NON FORMULARY     [provider]  ramipril (ALTACE) 10 MG capsule Take 10 mg by mouth daily.    [provider]    Allergies Patient has no known allergies.    Social History Social History   Tobacco Use  . Smoking status: Never Smoker  . Smokeless tobacco: Never Used  Vaping Use  . Vaping Use: Never used  Substance Use Topics  . Alcohol use: Yes  . Drug use: No    Review of Systems Patient denies headaches, rhinorrhea, blurry vision, numbness, shortness of breath, chest pain, edema, cough, abdominal pain, nausea, vomiting, diarrhea, dysuria, fevers, rashes or hallucinations unless otherwise stated above in HPI. ____________________________________________   PHYSICAL EXAM:  VITAL SIGNS: Vitals:   09/17/19 0645 09/17/19 1139  BP: (!) 178/96 (!) 172/102  Pulse: 67 69  Resp: 18 11  Temp: 98 F (36.7 C)   SpO2: 100% 100%    Constitutional: Alert and oriented.  Eyes: Conjunctivae are normal.  Head: Atraumatic. Nose: No congestion/rhinnorhea. Mouth/Throat: Mucous membranes are moist.   Neck: No stridor. Painless ROM.  Cardiovascular: Normal rate, regular rhythm. Grossly normal  heart sounds.  Good peripheral circulation. Respiratory: Normal respiratory effort.  No retractions. Lungs CTAB. Gastrointestinal: Soft and nontender. No distention. No abdominal bruits. No CVA tenderness. Genitourinary:  Musculoskeletal: No lower extremity tenderness nor edema.  No joint effusions. Neurologic:  Normal speech and language. No gross focal neurologic deficits are appreciated. No facial droop Skin:  Skin is warm, dry and intact. No rash noted. Psychiatric: Mood and affect are normal. Speech and behavior are  normal.  ____________________________________________   LABS (all labs ordered are listed, but only abnormal results are displayed)  Results for orders placed or performed during the hospital encounter of 09/17/19 (from the past 24 hour(s))  CBC with Differential     Status: None   Collection Time: 09/17/19  6:47 AM  Result Value Ref Range   WBC 7.8 4.0 - 10.5 K/uL   RBC 4.85 4.22 - 5.81 MIL/uL   Hemoglobin 14.8 13.0 - 17.0 g/dL   HCT 35.4 39 - 52 %   MCV 85.4 80.0 - 100.0 fL   MCH 30.5 26.0 - 34.0 pg   MCHC 35.7 30.0 - 36.0 g/dL   RDW 65.6 81.2 - 75.1 %   Platelets 252 150 - 400 K/uL   nRBC 0.0 0.0 - 0.2 %   Neutrophils Relative % 51 %   Neutro Abs 4.0 1.7 - 7.7 K/uL   Lymphocytes Relative 35 %   Lymphs Abs 2.7 0.7 - 4.0 K/uL   Monocytes Relative 10 %   Monocytes Absolute 0.8 0 - 1 K/uL   Eosinophils Relative 3 %   Eosinophils Absolute 0.2 0 - 0 K/uL   Basophils Relative 1 %   Basophils Absolute 0.1 0 - 0 K/uL   Immature Granulocytes 0 %   Abs Immature Granulocytes 0.03 0.00 - 0.07 K/uL  Comprehensive metabolic panel     Status: Abnormal   Collection Time: 09/17/19  6:47 AM  Result Value Ref Range   Sodium 138 135 - 145 mmol/L   Potassium 3.6 3.5 - 5.1 mmol/L   Chloride 103 98 - 111 mmol/L   CO2 24 22 - 32 mmol/L   Glucose, Bld 120 (H) 70 - 99 mg/dL   BUN 19 6 - 20 mg/dL   Creatinine, Ser 7.00 0.61 - 1.24 mg/dL   Calcium 9.0 8.9 - 17.4 mg/dL   Total Protein 7.6 6.5 - 8.1 g/dL   Albumin 4.6 3.5 - 5.0 g/dL   AST 20 15 - 41 U/L   ALT 25 0 - 44 U/L   Alkaline Phosphatase 34 (L) 38 - 126 U/L   Total Bilirubin 0.8 0.3 - 1.2 mg/dL   GFR calc non Af Amer >60 >60 mL/min   GFR calc Af Amer >60 >60 mL/min   Anion gap 11 5 - 15  Troponin I (High Sensitivity)     Status: None   Collection Time: 09/17/19  6:47 AM  Result Value Ref Range   Troponin I (High Sensitivity) 7 <18 ng/L  Troponin I (High Sensitivity)     Status: Abnormal   Collection Time: 09/17/19  8:46 AM   Result Value Ref Range   Troponin I (High Sensitivity) 474 (HH) <18 ng/L   ____________________________________________  EKG My review and personal interpretation at Time: 6:39   Indication: chest pain  Rate: 70  Rhythm: sinus Axis: normal Other: normal intervals, non specific st abn ____________________________________________  RADIOLOGY  I personally reviewed all radiographic images ordered to evaluate for the above acute complaints and reviewed radiology reports  and findings.  These findings were personally discussed with the patient.  Please see medical record for radiology report.  ____________________________________________   PROCEDURES  Procedure(s) performed:  .Critical Care Performed by: Willy Eddy, MD Authorized by: Willy Eddy, MD   Critical care provider statement:    Critical care time (minutes):  35   Critical care time was exclusive of:  Separately billable procedures and treating other patients   Critical care was necessary to treat or prevent imminent or life-threatening deterioration of the following conditions:  Cardiac failure   Critical care was time spent personally by me on the following activities:  Development of treatment plan with patient or surrogate, discussions with consultants, evaluation of patient's response to treatment, examination of patient, obtaining history from patient or surrogate, ordering and performing treatments and interventions, ordering and review of laboratory studies, ordering and review of radiographic studies, pulse oximetry, re-evaluation of patient's condition and review of old charts      Critical Care performed: yes ____________________________________________   INITIAL IMPRESSION / ASSESSMENT AND PLAN / ED COURSE  Pertinent labs & imaging results that were available during my care of the patient were reviewed by me and considered in my medical decision making (see chart for details).   DDX: ACS,  pericarditis, esophagitis, boerhaaves, pe, dissection, pna, bronchitis, costochondritis   Curtis Cooke is a 59 y.o. who presents to the ED with symptoms as described above.  Is currently pain-free.  Does have significant cardiac history of reporting pain shooting through to his shoulder and tingling in his arm.  Initial EKG is nonspecific he is hypertensive.  Will order CTA as his initial troponin is negative.  Will place on cardiac monitor.  He already received aspirin.  Will order home antihypertensive medications he did not take that this morning.  The patient will be placed on continuous pulse oximetry and telemetry for monitoring.  Laboratory evaluation will be sent to evaluate for the above complaints.     Clinical Course as of Sep 17 1251  Wed Sep 17, 2019  1247 Patient's repeat troponin is elevated to 475. His CTA fortunate doesn't show any evidence of dissection he remains pain-free at this time but given his chest pain and symptoms described earlier certainly concerning for cardiac event. Will heparinize. He already received aspirin. Will discuss with hospitalist for admission.   [PR]    Clinical Course User Index [PR] Willy Eddy, MD    The patient was evaluated in Emergency Department today for the symptoms described in the history of present illness. He/she was evaluated in the context of the global COVID-19 pandemic, which necessitated consideration that the patient might be at risk for infection with the SARS-CoV-2 virus that causes COVID-19. Institutional protocols and algorithms that pertain to the evaluation of patients at risk for COVID-19 are in a state of rapid change based on information released by regulatory bodies including the CDC and federal and state organizations. These policies and algorithms were followed during the patient's care in the ED.  As part of my medical decision making, I reviewed the following data within the electronic MEDICAL RECORD NUMBER Nursing notes  reviewed and incorporated, Labs reviewed, notes from prior ED visits and [place] Controlled Substance Database   ____________________________________________   FINAL CLINICAL IMPRESSION(S) / ED DIAGNOSES  Final diagnoses:  Chest pain, unspecified type      NEW MEDICATIONS STARTED DURING THIS VISIT:  New Prescriptions   No medications on file     Note:  This document  was prepared using Conservation officer, historic buildings and may include unintentional dictation errors.    Willy Eddy, MD 09/17/19 1254

## 2019-09-17 NOTE — ED Triage Notes (Addendum)
Patient ambulatory to triage with steady gait, without difficulty or distress noted; pt reports awoke PTA with left sided CP nonradiating accomp by HA; st hx of same; 324mg  ASA taken PTA

## 2019-09-17 NOTE — Consult Note (Signed)
ANTICOAGULATION CONSULT NOTE  Pharmacy Consult for Heparin Infusion Indication: chest pain/ACS  No Known Allergies  Patient Measurements: Height: 5\' 7"  (170.2 cm) Weight: 95.3 kg (210 lb) IBW/kg (Calculated) : 66.1 Heparin Dosing Weight: 86.4 kg  Vital Signs: Temp: 98 F (36.7 C) (08/04 0645) Temp Source: Oral (08/04 0645) BP: 172/102 (08/04 1139) Pulse Rate: 69 (08/04 1139)  Labs: Recent Labs    09/17/19 0647 09/17/19 0846  HGB 14.8  --   HCT 41.4  --   PLT 252  --   CREATININE 0.82  --   TROPONINIHS 7 474*    Estimated Creatinine Clearance: 105.4 mL/min (by C-G formula based on SCr of 0.82 mg/dL).   Medications:  No anticoagulation prior to admission per chart  Assessment: Patient is a 59 y/o M with medical history including CAD, hypertension, hyperlipidemia who presented to the ED 8/4 with chief complaint chest pain. Troponin 7 >> 474. Pharmacy has been consulted to initiate heparin infusion for ACS.  Baseline CBC within normal limits. Baseline aPTT and PT-INR pending.   Goal of Therapy:  Heparin level 0.3-0.7 units/ml Monitor platelets by anticoagulation protocol: Yes   Plan:  --Heparin 4000 unit IV bolus x 1 followed by continuous infusion at 1000 units/hr --Check HL 6 hours after initiation of infusion --Daily CBC per protocol  10/4 09/17/2019,12:57 PM

## 2019-09-17 NOTE — ED Notes (Signed)
Admitting MD at bedside.

## 2019-09-18 ENCOUNTER — Encounter: Payer: Self-pay | Admitting: Anesthesiology

## 2019-09-18 ENCOUNTER — Other Ambulatory Visit: Payer: Self-pay

## 2019-09-18 ENCOUNTER — Encounter
Admission: EM | Disposition: A | Discharge status: Home / Self Care | Payer: Self-pay | Source: Home / Self Care | Attending: Hospitalist

## 2019-09-18 ENCOUNTER — Inpatient Hospital Stay
Admit: 2019-09-18 | Discharge: 2019-09-18 | Disposition: A | Discharge status: Home / Self Care | Payer: 59 | Attending: Rehabilitative and Restorative Service Providers" | Admitting: Rehabilitative and Restorative Service Providers"

## 2019-09-18 DIAGNOSIS — I214 Non-ST elevation (NSTEMI) myocardial infarction: Principal | ICD-10-CM

## 2019-09-18 DIAGNOSIS — R079 Chest pain, unspecified: Secondary | ICD-10-CM

## 2019-09-18 HISTORY — PX: CORONARY STENT INTERVENTION: CATH118234

## 2019-09-18 HISTORY — PX: LEFT HEART CATH AND CORS/GRAFTS ANGIOGRAPHY: CATH118250

## 2019-09-18 LAB — BASIC METABOLIC PANEL
Anion gap: 9 (ref 5–15)
BUN: 18 mg/dL (ref 6–20)
CO2: 28 mmol/L (ref 22–32)
Calcium: 9.1 mg/dL (ref 8.9–10.3)
Chloride: 104 mmol/L (ref 98–111)
Creatinine, Ser: 0.97 mg/dL (ref 0.61–1.24)
GFR calc Af Amer: >60 mL/min (ref >60)
GFR calc non Af Amer: >60 mL/min (ref >60)
Glucose, Bld: 108 mg/dL — ABNORMAL HIGH (ref 70–99)
Potassium: 4 mmol/L (ref 3.5–5.1)
Sodium: 141 mmol/L (ref 135–145)

## 2019-09-18 LAB — HEPARIN LEVEL (UNFRACTIONATED): Heparin Unfractionated: 0.42 IU/mL (ref 0.30–0.70)

## 2019-09-18 LAB — HIV ANTIBODY (ROUTINE TESTING W REFLEX): HIV Screen 4th Generation wRfx: NONREACTIVE

## 2019-09-18 LAB — MAGNESIUM: Magnesium: 2.1 mg/dL (ref 1.7–2.4)

## 2019-09-18 LAB — POCT ACTIVATED CLOTTING TIME: Activated Clotting Time: 345 seconds

## 2019-09-18 SURGERY — LEFT HEART CATH AND CORS/GRAFTS ANGIOGRAPHY
Anesthesia: Moderate Sedation

## 2019-09-18 MED ORDER — ASPIRIN 81 MG PO CHEW
CHEWABLE_TABLET | ORAL | Status: AC
  Filled 2019-09-18: qty 3

## 2019-09-18 MED ORDER — LABETALOL HCL 5 MG/ML IV SOLN
INTRAVENOUS | Status: DC | PRN
  Administered 2019-09-18: 10 mg via INTRAVENOUS

## 2019-09-18 MED ORDER — BIVALIRUDIN BOLUS VIA INFUSION - CUPID
INTRAVENOUS | Status: DC | PRN
  Administered 2019-09-18: 78.6 mg via INTRAVENOUS

## 2019-09-18 MED ORDER — HEPARIN (PORCINE) IN NACL 1000-0.9 UT/500ML-% IV SOLN
INTRAVENOUS | Status: AC
  Filled 2019-09-18: qty 1000

## 2019-09-18 MED ORDER — SODIUM CHLORIDE 0.9% FLUSH: 3.0000 mL | INTRAVENOUS | Status: DC | PRN

## 2019-09-18 MED ORDER — ENOXAPARIN SODIUM 40 MG/0.4ML ~~LOC~~ SOLN
40.0000 mg | SUBCUTANEOUS | Status: DC
  Administered 2019-09-18: 40 mg via SUBCUTANEOUS
  Filled 2019-09-18: qty 0.4

## 2019-09-18 MED ORDER — SODIUM CHLORIDE 0.9 % WEIGHT BASED INFUSION
1.0000 mL/kg/h | INTRAVENOUS | Status: AC
  Administered 2019-09-18: 1 mL/kg/h via INTRAVENOUS

## 2019-09-18 MED ORDER — HEPARIN (PORCINE) IN NACL 1000-0.9 UT/500ML-% IV SOLN
INTRAVENOUS | Status: DC | PRN
  Administered 2019-09-18: 100 mL

## 2019-09-18 MED ORDER — BUTALBITAL-APAP-CAFFEINE 50-325-40 MG PO TABS: 1.0000 | ORAL_TABLET | Freq: Once | ORAL | Status: DC

## 2019-09-18 MED ORDER — DIPHENHYDRAMINE HCL 50 MG/ML IJ SOLN
25.0000 mg | Freq: Once | INTRAMUSCULAR | Status: AC
  Administered 2019-09-18: 25 mg via INTRAVENOUS
  Filled 2019-09-18: qty 1

## 2019-09-18 MED ORDER — LABETALOL HCL 5 MG/ML IV SOLN
INTRAVENOUS | Status: AC
  Filled 2019-09-18: qty 4

## 2019-09-18 MED ORDER — SODIUM CHLORIDE 0.9% FLUSH
3.0000 mL | Freq: Two times a day (BID) | INTRAVENOUS | Status: DC
  Administered 2019-09-18 – 2019-09-19 (×2): 3 mL via INTRAVENOUS

## 2019-09-18 MED ORDER — LABETALOL HCL 5 MG/ML IV SOLN: 10.0000 mg | INTRAVENOUS | Status: DC | PRN

## 2019-09-18 MED ORDER — PROCHLORPERAZINE EDISYLATE 10 MG/2ML IJ SOLN
10.0000 mg | Freq: Once | INTRAMUSCULAR | Status: AC
  Administered 2019-09-18: 10 mg via INTRAVENOUS
  Filled 2019-09-18: qty 2

## 2019-09-18 MED ORDER — ASPIRIN 81 MG PO CHEW
CHEWABLE_TABLET | ORAL | Status: DC | PRN
  Administered 2019-09-18: 243 mg via ORAL

## 2019-09-18 MED ORDER — TICAGRELOR 90 MG PO TABS
ORAL_TABLET | ORAL | Status: AC
  Filled 2019-09-18: qty 2

## 2019-09-18 MED ORDER — SODIUM CHLORIDE 0.9 % IV SOLN: 250.0000 mL | INTRAVENOUS | Status: DC | PRN

## 2019-09-18 MED ORDER — HYDRALAZINE HCL 20 MG/ML IJ SOLN
10.0000 mg | INTRAMUSCULAR | Status: DC | PRN
  Administered 2019-09-18: 10 mg via INTRAVENOUS
  Filled 2019-09-18: qty 1

## 2019-09-18 MED ORDER — MIDAZOLAM HCL 2 MG/2ML IJ SOLN
INTRAMUSCULAR | Status: DC | PRN
  Administered 2019-09-18 (×2): 1 mg via INTRAVENOUS

## 2019-09-18 MED ORDER — ASPIRIN 81 MG PO CHEW
81.0000 mg | CHEWABLE_TABLET | Freq: Every day | ORAL | Status: DC
  Administered 2019-09-19: 81 mg via ORAL
  Filled 2019-09-18: qty 1

## 2019-09-18 MED ORDER — ACETAMINOPHEN 325 MG PO TABS
ORAL_TABLET | ORAL | Status: AC
  Filled 2019-09-18: qty 2

## 2019-09-18 MED ORDER — ACETAMINOPHEN 325 MG PO TABS
650.0000 mg | ORAL_TABLET | ORAL | Status: DC | PRN
  Administered 2019-09-18: 650 mg via ORAL

## 2019-09-18 MED ORDER — FENTANYL CITRATE (PF) 100 MCG/2ML IJ SOLN
INTRAMUSCULAR | Status: DC | PRN
  Administered 2019-09-18: 25 ug via INTRAVENOUS

## 2019-09-18 MED ORDER — TICAGRELOR 90 MG PO TABS
90.0000 mg | ORAL_TABLET | Freq: Two times a day (BID) | ORAL | Status: DC
  Administered 2019-09-18 – 2019-09-19 (×2): 90 mg via ORAL
  Filled 2019-09-18 (×2): qty 1

## 2019-09-18 MED ORDER — IOHEXOL 300 MG/ML  SOLN
INTRAMUSCULAR | Status: DC | PRN
  Administered 2019-09-18: 95 mL

## 2019-09-18 MED ORDER — BIVALIRUDIN TRIFLUOROACETATE 250 MG IV SOLR
INTRAVENOUS | Status: AC
  Filled 2019-09-18: qty 250

## 2019-09-18 MED ORDER — TICAGRELOR 90 MG PO TABS
ORAL_TABLET | ORAL | Status: DC | PRN
  Administered 2019-09-18: 180 mg via ORAL

## 2019-09-18 MED ORDER — HYDROCODONE-ACETAMINOPHEN 5-325 MG PO TABS: 2.0000 | ORAL_TABLET | Freq: Four times a day (QID) | ORAL | Status: DC | PRN

## 2019-09-18 MED ORDER — LIDOCAINE HCL (PF) 1 % IJ SOLN
INTRAMUSCULAR | Status: AC
  Filled 2019-09-18: qty 30

## 2019-09-18 MED ORDER — SODIUM CHLORIDE 0.9 % IV BOLUS
500.0000 mL | Freq: Once | INTRAVENOUS | Status: AC
  Administered 2019-09-18: 500 mL via INTRAVENOUS

## 2019-09-18 MED ORDER — FENTANYL CITRATE (PF) 100 MCG/2ML IJ SOLN
INTRAMUSCULAR | Status: DC | PRN
  Administered 2019-09-18: 25 ug via INTRAVENOUS
  Administered 2019-09-18: 50 ug via INTRAVENOUS

## 2019-09-18 MED ORDER — SODIUM CHLORIDE 0.9 % IV SOLN
0.2500 mg/kg/h | INTRAVENOUS | Status: AC
  Filled 2019-09-18: qty 250

## 2019-09-18 MED ORDER — ONDANSETRON HCL 4 MG/2ML IJ SOLN: 4.0000 mg | Freq: Four times a day (QID) | INTRAMUSCULAR | Status: DC | PRN

## 2019-09-18 MED ORDER — FENTANYL CITRATE (PF) 100 MCG/2ML IJ SOLN
INTRAMUSCULAR | Status: AC
  Filled 2019-09-18: qty 2

## 2019-09-18 MED ORDER — MIDAZOLAM HCL 2 MG/2ML IJ SOLN
INTRAMUSCULAR | Status: AC
  Filled 2019-09-18: qty 2

## 2019-09-18 MED ORDER — IOHEXOL 300 MG/ML  SOLN
INTRAMUSCULAR | Status: DC | PRN
  Administered 2019-09-18: 165 mL via INTRA_ARTERIAL

## 2019-09-18 MED ORDER — SODIUM CHLORIDE 0.9 % IV SOLN
INTRAVENOUS | Status: AC | PRN
  Administered 2019-09-18: 1.75 mg/kg/h via INTRAVENOUS
  Administered 2019-09-18: 0.25 mg/kg/h via INTRAVENOUS

## 2019-09-18 SURGICAL SUPPLY — 19 items
BALLN TREK RX 2.5X15 (BALLOONS) ×3
BALLOON TREK RX 2.5X15 (BALLOONS) ×1 IMPLANT
CATH INFINITI 5 FR IM (CATHETERS) ×3 IMPLANT
CATH INFINITI 5FR ANG PIGTAIL (CATHETERS) ×3 IMPLANT
CATH INFINITI 5FR JL4 (CATHETERS) ×3 IMPLANT
CATH INFINITI JR4 5F (CATHETERS) ×3 IMPLANT
CATH VISTA GUIDE 6FR JR4 SH (CATHETERS) ×3 IMPLANT
DEVICE CLOSURE MYNXGRIP 6/7F (Vascular Products) ×3 IMPLANT
DEVICE INFLAT 30 PLUS (MISCELLANEOUS) ×3 IMPLANT
KIT MANI 3VAL PERCEP (MISCELLANEOUS) ×3 IMPLANT
NEEDLE PERC 18GX7CM (NEEDLE) ×3 IMPLANT
PACK CARDIAC CATH (CUSTOM PROCEDURE TRAY) ×3 IMPLANT
SHEATH AVANTI 5FR X 11CM (SHEATH) ×3 IMPLANT
SHEATH AVANTI 6FR X 11CM (SHEATH) ×3 IMPLANT
STENT RESOLUTE ONYX 2.5X15 (Permanent Stent) ×3 IMPLANT
STENT RESOLUTE ONYX 3.5X18 (Permanent Stent) ×3 IMPLANT
WIRE EMERALD 3MM-J .035X260CM (WIRE) ×3 IMPLANT
WIRE G HI TQ BMW 190 (WIRE) ×3 IMPLANT
WIRE GUIDERIGHT .035X150 (WIRE) ×3 IMPLANT

## 2019-09-18 NOTE — Consult Note (Signed)
ANTICOAGULATION CONSULT NOTE  Pharmacy Consult for Heparin Infusion Indication: chest pain/ACS  No Known Allergies  Patient Measurements: Height: 5\' 7"  (170.2 cm) Weight: 104.8 kg (231 lb) IBW/kg (Calculated) : 66.1 Heparin Dosing Weight: 86.4 kg  Vital Signs: Temp: 98 F (36.7 C) (08/05 0437) Temp Source: Oral (08/05 0032) BP: 136/84 (08/05 0437) Pulse Rate: 70 (08/05 0437)  Labs: Recent Labs    09/17/19 0647 09/17/19 0647 09/17/19 0846 09/17/19 1309 09/17/19 1351 09/17/19 1834 09/17/19 1942 09/18/19 0504  HGB 14.8  --   --   --   --   --   --   --   HCT 41.4  --   --   --   --   --   --   --   PLT 252  --   --   --   --   --   --   --   APTT  --   --   --  31  --   --   --   --   LABPROT  --   --   --  12.1  --   --   --   --   INR  --   --   --  0.9  --   --   --   --   HEPARINUNFRC  --   --   --   --   --   --  0.17* 0.42  CREATININE 0.82  --   --   --   --   --   --  0.97  TROPONINIHS 7   < > 474*  --  978* 1,053*  --   --    < > = values in this interval not displayed.    Estimated Creatinine Clearance: 93.5 mL/min (by C-G formula based on SCr of 0.97 mg/dL).   Medications:  No anticoagulation prior to admission per chart  Assessment: Patient is a 59 y/o M with medical history including CAD, hypertension, hyperlipidemia who presented to the ED 8/4 with chief complaint chest pain. Troponin 7 >> 474. Pharmacy has been consulted to initiate heparin infusion for ACS.  Baseline CBC within normal limits. Baseline aPTT and PT-INR pending.   Goal of Therapy:  Heparin level 0.3-0.7 units/ml Monitor platelets by anticoagulation protocol: Yes   Plan:  --8/5 at 0504 HL = 0.42, therapeutic x 1. Will continue drip at 1250 units/hr --Check HL in 6 hours to confirm --Daily CBC per protocol  10/4 A 09/18/2019,6:39 AM

## 2019-09-18 NOTE — Progress Notes (Signed)
Patient Name: Jaquan Sadowsky Date of Encounter: 09/18/2019  Hospital Problem List     Active Problems:   Left-sided chest pain    Patient Profile     59 year old male with a past medical history significant for coronary artery disease s/p CABG and PCIs in [place], hypercholesterolemia, and hypertension who presented to the ED on 09/17/19 for an acute onset of chest pain pain, radiating down his left arm, with associated diaphoresis. Workup in the ED has been significant for an ECG revealing sinus rhythm with no obvious acute ischemic changes, high sensitivity troponin 7 and 474 respectively, chest xray negative for acute cardiopulmonary disease, and chest CT negative for a PE or an aortic dissection or aneurysm.   Mr. Ollinger has an extensive cardiac history dating back to 2001 where he had bypass surgery with PCIs a few years later.  This was all done in [place] and records are currently unavailable.   Subjective   No chest pain this morning  Inpatient Medications    . acetaminophen      . aspirin  81 mg Oral Daily  . [MAR Hold] aspirin EC  81 mg Oral Daily  . [MAR Hold] atorvastatin  80 mg Oral Daily  . [MAR Hold] metoprolol tartrate  12.5 mg Oral BID  . [MAR Hold] ramipril  10 mg Oral Daily  . [facility] Hold] sodium chloride flush  3 mL Intravenous Q12H  . sodium chloride flush  3 mL Intravenous Q12H  . ticagrelor  90 mg Oral BID    Vital Signs    Vitals:   09/18/19 1330 09/18/19 1345 09/18/19 1400 09/18/19 1425  BP: 135/79  (!) 144/92   Pulse:   (!) 57 60  Resp:  18 20 18   Temp:      TempSrc:      SpO2: 99%  96% 100%  Weight:      Height:        Intake/Output Summary (Last 24 hours) at 09/18/2019 1439 Last data filed at 09/18/2019 1300 Gross per 24 hour  Intake 576.1 ml  Output 1975 ml  Net -1398.9 ml   Filed Weights   09/17/19 2031 09/18/19 0437 09/18/19 0948  Weight: 96.6 kg 104.8 kg 104.8 kg    Physical Exam    GEN: Well nourished, well developed, in no acute  distress.  HEENT: normal.  Neck: Supple, no JVD, carotid bruits, or masses. Cardiac: RRR, no murmurs, rubs, or gallops. No clubbing, cyanosis, edema.  Radials/DP/PT 2+ and equal bilaterally.  Respiratory:  Respirations regular and unlabored, clear to auscultation bilaterally. GI: Soft, nontender, nondistended, BS + x 4. MS: no deformity or atrophy. Skin: warm and dry, no rash. Neuro:  Strength and sensation are intact. Psych: Normal affect.  Labs    CBC Recent Labs    09/17/19 0647  WBC 7.8  NEUTROABS 4.0  HGB 14.8  HCT 41.4  MCV 85.4  PLT 252   Basic Metabolic Panel Recent Labs    11/17/19 0647 09/18/19 0504  NA 138 141  K 3.6 4.0  CL 103 104  CO2 24 28  GLUCOSE 120* 108*  BUN 19 18  CREATININE 0.82 0.97  CALCIUM 9.0 9.1  MG  --  2.1   Liver Function Tests Recent Labs    09/17/19 0647  AST 20  ALT 25  ALKPHOS 34*  BILITOT 0.8  PROT 7.6  ALBUMIN 4.6   No results for input(s): LIPASE, AMYLASE in the last 72 hours. Cardiac Enzymes  No results for input(s): CKTOTAL, CKMB, CKMBINDEX, TROPONINI in the last 72 hours. BNP No results for input(s): BNP in the last 72 hours. D-Dimer No results for input(s): DDIMER in the last 72 hours. Hemoglobin A1C Recent Labs    09/17/19 1555  HGBA1C 5.4   Fasting Lipid Panel Recent Labs    09/17/19 1555  CHOL 208*  HDL 30*  LDLCALC 145*  TRIG 167*  CHOLHDL 6.9   Thyroid Function Tests Recent Labs    09/17/19 1942  TSH 1.855    Telemetry    Sinus rhythm  ECG    Sinus rhythm nonspecific ST-T wave changes  Radiology    DG Chest 2 View  Result Date: 09/17/2019 CLINICAL DATA:  Chest pain EXAM: CHEST - 2 VIEW COMPARISON:  None. FINDINGS: Normal heart size and mediastinal contours. Prior median sternotomy. No acute infiltrate or edema. No effusion or pneumothorax. No acute osseous findings. IMPRESSION: No active cardiopulmonary disease. Electronically Signed   By: Marnee Spring M.D.   On: 09/17/2019 07:36    CARDIAC CATHETERIZATION  Result Date: 09/18/2019  Suezanne Jacquet Cx to Prox Cx lesion is 90% stenosed.  Mid LAD lesion is 100% stenosed.  Prox RCA lesion is 75% stenosed.  Mid Cx lesion is 100% stenosed.  Origin to Prox Graft lesion is 100% stenosed.  Prox Graft lesion is 99% stenosed.  Previously placed Mid RCA stent (unknown type) is widely patent.  LM normal LAD occluded proximal LCx-occluded mid RCA-75-80% proximal LIMA to lad-patent SVG-om chronically occluded SVG to al 99% proxiimal stenosis likely irv. No svg seen to rca.   CT ANGIO CHEST AORTA W/CM & OR WO/CM  Result Date: 09/17/2019 CLINICAL DATA:  Left chest pain and headache today. EXAM: CT ANGIOGRAPHY CHEST WITH AND WITHOUT CONTRAST TECHNIQUE: Multidetector CT imaging of the chest was performed using the standard protocol before and during bolus administration of intravenous contrast. Multiplanar CT image reconstructions and MIPs were obtained to evaluate the vascular anatomy. CONTRAST:  100 mL OMNIPAQUE IOHEXOL 350 MG/ML SOLN COMPARISON:  None. FINDINGS: Cardiovascular: Preferential opacification of the thoracic aorta. No evidence of thoracic aortic aneurysm or dissection. Normal heart size. No pericardial effusion. The patient is status post CABG. Calcific aortic and coronary atherosclerosis noted. Mediastinum/Nodes: No enlarged mediastinal, hilar, or axillary lymph nodes. Thyroid gland, trachea, and esophagus demonstrate no significant findings. Lungs/Pleura: Lungs are clear. No pleural effusion or pneumothorax. Upper Abdomen: Negative. Musculoskeletal: No chest wall abnormality. No acute or significant osseous findings. Review of the MIP images confirms the above findings. IMPRESSION: Negative for aortic dissection or aneurysm.  No acute abnormality. Aortic Atherosclerosis (ICD10-I70.0). Electronically Signed   By: Drusilla Kanner M.D.   On: 09/17/2019 12:34    Assessment & Plan    59 year old male with a past medical history significant  for coronary artery disease s/p CABG and PCIs in [place], hypercholesterolemia, and hypertension who presented to the ED on 09/17/19 for an acute onset of chest pain pain, radiating down his left arm, with associated diaphoresis. Workup in the ED has been significant for an ECG revealing sinus rhythm with no obvious acute ischemic changes, high sensitivity troponin 7 and 474 respectively, chest xray negative for acute cardiopulmonary disease, and chest CT negative for a PE or an aortic dissection or aneurysm.   Mr. Engelmann has an extensive cardiac history dating back to 2001 where he had bypass surgery with PCIs a few years later.  This was all done in [place] and records are currently unavailable.  Non-STEMI.-Status post cardiac cath today.  Revealed normal left main, proximal to mid 100% stenosis of the LAD, mid stenosis of the left circumflex with a 75 to 85% stenosis of the proximal RCA.  Left internal mammary to the LAD was widely patent with collaterals to the marginal system.  Saphenous vein graft to the anterolateral distribution was patent but had a 99% stenosis proximally.  Saphenous vein graft apparently to the OM system was occluded.  No visible vein graft to the RCA.  CABG records are still not available.  Patient underwent PCI of the saphenous vein graft to the anterolateral as well as PCI of the RCA native vessel.  Good result.  Doing well.  Will loaded with Brilinta and continue with aspirin high intensity statins beta-blockers.  If stable plan discharge in the morning.  Signed, Darlin Priestly Saatvik Thielman MD 09/18/2019, 2:39 PM  Pager: [phone]

## 2019-09-18 NOTE — Progress Notes (Signed)
*  PRELIMINARY RESULTS* Echocardiogram 2D Echocardiogram has been performed.  Curtis Cooke Curtis Cooke 09/18/2019, 9:13 AM

## 2019-09-18 NOTE — Progress Notes (Addendum)
Patient back from cath lab. Vital signs obtained, right groin site level 0. Patient complaining of mild headache but denies hydrocodone-acetaminophen for now. Requesting lights turned off and some rest to see if that will help with headache.   Update 1557: [name] came out to nurses station to ask for pain medication for patient. Patient would like something else other than the hydrocodone as he has had it before and does not like how it makes him feel. Dr. Fran Lowes paged to notify.  MD notified of BP, HR, and pain. New orders in by MD.   Update 1801:Patient started feeling "very short of breath and cold" . Vital signs taken and patient's blood pressure dropped (see flowsheet). MD paged to notify as patient was symptomatic. New orders in by MD.  Fluid bolus given. Vital signs more frequent. Upon completion of bolus patient feeling a lot better. Blood pressure coming up and patient finally sleeping. No further complaints. (see flowsheet for blood pressure readings)

## 2019-09-18 NOTE — Progress Notes (Signed)
PROGRESS NOTE    Curtis Cooke  ZOX:[id number] DOB: 1960/02/28 DOA: 09/17/2019 PCP: Marisue Ivan, MD    Assessment & Plan:   Active Problems:   Left-sided chest pain    Chief Complaint: chest pain  HPI: Curtis Cooke is a 59 y.o. male with medical history significant of CAD s/p 5-vessel CABG in 2001, HTN who presented with left-sided chest pain.      # Left-sided chest pain 2/2 NSTEMI --trop 7 then uptrended to 474.  Chest pain relieved with nitro.  Currently chest pain-free and HDS. --started on heparin gtt in the ED. --Cardiology consulted --LDL elevated PLAN: --left heart cath today, underwent PCI of the saphenous vein graft to the anterolateral as well as PCI of the RCA native vessel.  --loaded with Brilinta and continue with aspirin  --continue statin --Hold metop tonight due to hypotension --continue beta-blocker and ACEi at discharge.  # Hx of CAD s/p 5-vessel CABG in 2001 --Has had no issues until this morning. PLAN: --Hold metop tonight due to hypotension --continue beta-blocker and ACEi at discharge. --continue home ASA 81 and Lipitor 80mg  daily  # HTN --Hold metop tonight due to hypotension --continue beta-blocker and ACEi at discharge.  # Headache --migraine cocktail with IV benadryl and compazine   DVT prophylaxis: Lovenox SQ Code Status: Full code  Family Communication: wife updated at bedside today Status is: inpatient Dispo:   The patient is from: home Anticipated d/c is to: home Anticipated d/c date is: tomorrow Patient currently is not medically stable to d/c due to: need overnight monitoring post-cath, per cards.   Subjective and Interval History:  Pt went for left heart cath today and received stents.  Post-procedure, pt reported headache.   Objective: Vitals:   09/18/19 1657 09/18/19 1701 09/18/19 1712 09/18/19 1730  BP: (!) 85/48 (!) 85/48 (!) 98/50 (!) 108/50  Pulse: 63  65 65  Resp:      Temp:      TempSrc:      SpO2: 100%      Weight:      Height:        Intake/Output Summary (Last 24 hours) at 09/18/2019 1818 Last data filed at 09/18/2019 1757 Gross per 24 hour  Intake 1129.73 ml  Output 1975 ml  Net -845.27 ml   Filed Weights   09/17/19 2031 09/18/19 0437 09/18/19 0948  Weight: 96.6 kg 104.8 kg 104.8 kg    Examination:   Constitutional: NAD, AAOx3 HEENT: conjunctivae and lids normal, EOMI CV: No cyanosis.   RESP: normal respiratory effort  Extremities: No effusions, edema, or tenderness in BLE SKIN: warm, dry and intact Neuro: II - XII grossly intact.   Psych: Normal mood and affect.      Data Reviewed: I have personally reviewed following labs and imaging studies  CBC: Recent Labs  Lab 09/17/19 0647  WBC 7.8  NEUTROABS 4.0  HGB 14.8  HCT 41.4  MCV 85.4  PLT 252   Basic Metabolic Panel: Recent Labs  Lab 09/17/19 0647 09/18/19 0504  NA 138 141  K 3.6 4.0  CL 103 104  CO2 24 28  GLUCOSE 120* 108*  BUN 19 18  CREATININE 0.82 0.97  CALCIUM 9.0 9.1  MG  --  2.1   GFR: Estimated Creatinine Clearance: 93.5 mL/min (by C-G formula based on SCr of 0.97 mg/dL). Liver Function Tests: Recent Labs  Lab 09/17/19 0647  AST 20  ALT 25  ALKPHOS 34*  BILITOT 0.8  PROT 7.6  ALBUMIN  4.6   No results for input(s): LIPASE, AMYLASE in the last 168 hours. No results for input(s): AMMONIA in the last 168 hours. Coagulation Profile: Recent Labs  Lab 09/17/19 1309  INR 0.9   Cardiac Enzymes: No results for input(s): CKTOTAL, CKMB, CKMBINDEX, TROPONINI in the last 168 hours. BNP (last 3 results) No results for input(s): PROBNP in the last 8760 hours. HbA1C: Recent Labs    09/17/19 1555  HGBA1C 5.4   CBG: No results for input(s): GLUCAP in the last 168 hours. Lipid Profile: Recent Labs    09/17/19 1555  CHOL 208*  HDL 30*  LDLCALC 145*  TRIG 167*  CHOLHDL 6.9   Thyroid Function Tests: Recent Labs    09/17/19 1942  TSH 1.855   Anemia Panel: No results for  input(s): VITAMINB12, FOLATE, FERRITIN, TIBC, IRON, RETICCTPCT in the last 72 hours. Sepsis Labs: No results for input(s): PROCALCITON, LATICACIDVEN in the last 168 hours.  Recent Results (from the past 240 hour(s))  SARS Coronavirus 2 by RT PCR (hospital order, performed in [facility] hospital lab) Nasopharyngeal Nasopharyngeal Swab     Status: None   Collection Time: 09/17/19 11:39 AM   Specimen: Nasopharyngeal Swab  Result Value Ref Range Status   SARS Coronavirus 2 NEGATIVE NEGATIVE Final    Comment: (NOTE) SARS-CoV-2 target nucleic acids are NOT DETECTED.  The SARS-CoV-2 RNA is generally detectable in upper and lower respiratory specimens during the acute phase of infection. The lowest concentration of SARS-CoV-2 viral copies this assay can detect is 250 copies / mL. A negative result does not preclude SARS-CoV-2 infection and should not be used as the sole basis for treatment or other patient management decisions.  A negative result may occur with improper specimen collection / handling, submission of specimen other than nasopharyngeal swab, presence of viral mutation(s) within the areas targeted by this assay, and inadequate number of viral copies (<250 copies / mL). A negative result must be combined with clinical observations, patient history, and epidemiological information.  Fact Sheet for Patients:   BoilerBrush.com.cy  Fact Sheet for Healthcare Providers: https://pope.com/  This test is not yet approved or  cleared by the [place] FDA and has been authorized for detection and/or diagnosis of SARS-CoV-2 by FDA under an Emergency Use Authorization (EUA).  This EUA will remain in effect (meaning this test can be used) for the duration of the COVID-19 declaration under Section 564(b)(1) of the Act, 21 U.S.C. section 360bbb-3(b)(1), unless the authorization is terminated or revoked sooner.  Performed at [facility], [address], [place], [place] [address]       Radiology Studies: DG Chest 2 View  Result Date: 09/17/2019 CLINICAL DATA:  Chest pain EXAM: CHEST - 2 VIEW COMPARISON:  None. FINDINGS: Normal heart size and mediastinal contours. Prior median sternotomy. No acute infiltrate or edema. No effusion or pneumothorax. No acute osseous findings. IMPRESSION: No active cardiopulmonary disease. Electronically Signed   By: Marnee Spring M.D.   On: 09/17/2019 07:36   CARDIAC CATHETERIZATION  Result Date: 09/18/2019  Suezanne Jacquet Cx to Prox Cx lesion is 90% stenosed.  Mid LAD lesion is 100% stenosed.  Prox RCA lesion is 75% stenosed.  Mid Cx lesion is 100% stenosed.  Origin to Prox Graft lesion is 100% stenosed.  Prox Graft lesion is 99% stenosed.  Previously placed Mid RCA stent (unknown type) is widely patent.  LM normal LAD occluded proximal LCx-occluded mid RCA-75-80% proximal LIMA to lad-patent SVG-om chronically occluded SVG to  al 99% proxiimal stenosis likely irv. No svg seen to rca.   CT ANGIO CHEST AORTA W/CM & OR WO/CM  Result Date: 09/17/2019 CLINICAL DATA:  Left chest pain and headache today. EXAM: CT ANGIOGRAPHY CHEST WITH AND WITHOUT CONTRAST TECHNIQUE: Multidetector CT imaging of the chest was performed using the standard protocol before and during bolus administration of intravenous contrast. Multiplanar CT image reconstructions and MIPs were obtained to evaluate the vascular anatomy. CONTRAST:  100 mL OMNIPAQUE IOHEXOL 350 MG/ML SOLN COMPARISON:  None. FINDINGS: Cardiovascular: Preferential opacification of the thoracic aorta. No evidence of thoracic aortic aneurysm or dissection. Normal heart size. No pericardial effusion. The patient is status post CABG. Calcific aortic and coronary atherosclerosis noted. Mediastinum/Nodes: No enlarged mediastinal, hilar, or axillary lymph nodes. Thyroid gland, trachea, and esophagus demonstrate no significant findings. Lungs/Pleura: Lungs are  clear. No pleural effusion or pneumothorax. Upper Abdomen: Negative. Musculoskeletal: No chest wall abnormality. No acute or significant osseous findings. Review of the MIP images confirms the above findings. IMPRESSION: Negative for aortic dissection or aneurysm.  No acute abnormality. Aortic Atherosclerosis (ICD10-I70.0). Electronically Signed   By: Drusilla Kanner M.D.   On: 09/17/2019 12:34     Scheduled Meds: . aspirin  81 mg Oral Daily  . aspirin EC  81 mg Oral Daily  . atorvastatin  80 mg Oral Daily  . ramipril  10 mg Oral Daily  . sodium chloride flush  3 mL Intravenous Q12H  . sodium chloride flush  3 mL Intravenous Q12H  . ticagrelor  90 mg Oral BID   Continuous Infusions: . sodium chloride    . sodium chloride 1 mL/kg/hr (09/18/19 1520)     LOS: 1 day     Darlin Priestly, MD [facility] If 7PM-7AM, please contact night-coverage 09/18/2019, 6:18 PM

## 2019-09-18 NOTE — Progress Notes (Signed)
Pt. Med. With 2 plain Tylenol 650 mg p.o. for c/o HA "7" ON scale 1-10. Dr. Lady Gary at bedside, explaining cath/PCI results to pt. And his wife. Both verbalize understanding of conversation.

## 2019-09-19 ENCOUNTER — Other Ambulatory Visit: Payer: Self-pay

## 2019-09-19 DIAGNOSIS — R079 Chest pain, unspecified: Secondary | ICD-10-CM

## 2019-09-19 LAB — BASIC METABOLIC PANEL
Anion gap: 10 (ref 5–15)
BUN: 9 mg/dL (ref 6–20)
CO2: 25 mmol/L (ref 22–32)
Calcium: 8.7 mg/dL — ABNORMAL LOW (ref 8.9–10.3)
Chloride: 104 mmol/L (ref 98–111)
Creatinine, Ser: 0.82 mg/dL (ref 0.61–1.24)
GFR calc Af Amer: >60 mL/min (ref >60)
GFR calc non Af Amer: >60 mL/min (ref >60)
Glucose, Bld: 109 mg/dL — ABNORMAL HIGH (ref 70–99)
Potassium: 3.9 mmol/L (ref 3.5–5.1)
Sodium: 139 mmol/L (ref 135–145)

## 2019-09-19 LAB — CBC
HCT: 40.2 % (ref 39.0–52.0)
Hemoglobin: 14 g/dL (ref 13.0–17.0)
MCH: 30.6 pg (ref 26.0–34.0)
MCHC: 34.8 g/dL (ref 30.0–36.0)
MCV: 87.8 fL (ref 80.0–100.0)
Platelets: 222 10*3/uL (ref 150–400)
RBC: 4.58 MIL/uL (ref 4.22–5.81)
RDW: 12.8 % (ref 11.5–15.5)
WBC: 10.3 10*3/uL (ref 4.0–10.5)
nRBC: 0 % (ref 0.0–0.2)

## 2019-09-19 LAB — MAGNESIUM: Magnesium: 1.9 mg/dL (ref 1.7–2.4)

## 2019-09-19 MED ORDER — ATORVASTATIN CALCIUM 80 MG PO TABS: 80.0000 mg | ORAL_TABLET | Freq: Every day | ORAL | 2 refills | Status: DC

## 2019-09-19 MED ORDER — METOPROLOL TARTRATE 25 MG PO TABS: 12.5000 mg | ORAL_TABLET | Freq: Two times a day (BID) | ORAL | 2 refills | Status: DC

## 2019-09-19 MED ORDER — RAMIPRIL 10 MG PO CAPS: 10.0000 mg | ORAL_CAPSULE | Freq: Every day | ORAL | 2 refills | Status: AC

## 2019-09-19 MED ORDER — TICAGRELOR 90 MG PO TABS: 90.0000 mg | ORAL_TABLET | Freq: Two times a day (BID) | ORAL | 11 refills | Status: AC

## 2019-09-19 MED ORDER — CLOPIDOGREL BISULFATE 75 MG PO TABS: 75.0000 mg | ORAL_TABLET | Freq: Every day | ORAL | 11 refills | Status: DC

## 2019-09-19 NOTE — Progress Notes (Addendum)
Patient Name: Curtis Cooke Date of Encounter: 09/19/2019  Hospital Problem List     Active Problems:   Left-sided chest pain    Patient Profile     59 year old male with a past medical history significant for coronary artery disease s/p CABG and PCIs in [place], hypercholesterolemia, and hypertension who presented to the ED on 09/17/19 for an acute onset of chest pain pain, radiating down his left arm, with associated diaphoresis. Workup in the ED has been significant foranECG revealing sinus rhythm with no obvious acute ischemic changes, high sensitivity troponin 7 and 474respectively, chest xray negative for acute cardiopulmonary disease, and chest CT negative for a PE or an aortic dissection or aneurysm.   Subjective   Feels well this morning with no chest pain.  Cath site mild tenderness but no hematoma with distal pulses intact.  Inpatient Medications     aspirin  81 mg Oral Daily   atorvastatin  80 mg Oral Daily   enoxaparin (LOVENOX) injection  40 mg Subcutaneous Q24H   ramipril  10 mg Oral Daily   sodium chloride flush  3 mL Intravenous Q12H   sodium chloride flush  3 mL Intravenous Q12H   ticagrelor  90 mg Oral BID    Vital Signs    Vitals:   09/18/19 2000 09/18/19 2158 09/19/19 0402 09/19/19 0755  BP: 132/77  135/83 134/78  Pulse: 87 70 81 70  Resp:  20 16 18   Temp:  98.2 F (36.8 C) 97.7 F (36.5 C) 98 F (36.7 C)  TempSrc:  Oral Oral Oral  SpO2:  99% 100% 99%  Weight:   103.8 kg   Height:        Intake/Output Summary (Last 24 hours) at 09/19/2019 0817 Last data filed at 09/19/2019 0755 Gross per 24 hour  Intake 1053.63 ml  Output 1275 ml  Net -221.37 ml   Filed Weights   09/18/19 0437 09/18/19 0948 09/19/19 0402  Weight: 104.8 kg 104.8 kg 103.8 kg    Physical Exam    GEN: Well nourished, well developed, in no acute distress.  HEENT: normal.  Neck: Supple, no JVD, carotid bruits, or masses. Cardiac: RRR, no murmurs, rubs, or gallops. No  clubbing, cyanosis, edema.  Radials/DP/PT 2+ and equal bilaterally.  Respiratory:  Respirations regular and unlabored, clear to auscultation bilaterally. GI: Soft, nontender, nondistended, BS + x 4. MS: no deformity or atrophy. Skin: warm and dry, no rash. Neuro:  Strength and sensation are intact. Psych: Normal affect.  Labs    CBC Recent Labs    09/17/19 0647 09/19/19 0522  WBC 7.8 10.3  NEUTROABS 4.0  --   HGB 14.8 14.0  HCT 41.4 40.2  MCV 85.4 87.8  PLT 252 883   Basic Metabolic Panel Recent Labs    09/18/19 0504 09/19/19 0522  NA 141 139  K 4.0 3.9  CL 104 104  CO2 28 25  GLUCOSE 108* 109*  BUN 18 9  CREATININE 0.97 0.82  CALCIUM 9.1 8.7*  MG 2.1 1.9   Liver Function Tests Recent Labs    09/17/19 0647  AST 20  ALT 25  ALKPHOS 34*  BILITOT 0.8  PROT 7.6  ALBUMIN 4.6   No results for input(s): LIPASE, AMYLASE in the last 72 hours. Cardiac Enzymes No results for input(s): CKTOTAL, CKMB, CKMBINDEX, TROPONINI in the last 72 hours. BNP No results for input(s): BNP in the last 72 hours. D-Dimer No results for input(s): DDIMER in the last  72 hours. Hemoglobin A1C Recent Labs    09/17/19 1555  HGBA1C 5.4   Fasting Lipid Panel Recent Labs    09/17/19 1555  CHOL 208*  HDL 30*  LDLCALC 145*  TRIG 167*  CHOLHDL 6.9   Thyroid Function Tests Recent Labs    09/17/19 1942  TSH 1.855    Telemetry    Normal sinus rhythm  ECG    Normal sinus rhythm with no ischemia  Radiology    DG Chest 2 View  Result Date: 09/17/2019 CLINICAL DATA:  Chest pain EXAM: CHEST - 2 VIEW COMPARISON:  None. FINDINGS: Normal heart size and mediastinal contours. Prior median sternotomy. No acute infiltrate or edema. No effusion or pneumothorax. No acute osseous findings. IMPRESSION: No active cardiopulmonary disease. Electronically Signed   By: Monte Fantasia M.D.   On: 09/17/2019 07:36   CARDIAC CATHETERIZATION  Result Date: 09/19/2019  Mid LAD lesion is 100%  stenosed.  Ost Cx to Prox Cx lesion is 90% stenosed.  Mid Cx lesion is 100% stenosed.  Prox RCA lesion is 75% stenosed.  Non-stenotic Mid RCA lesion was previously treated.  Prox Graft lesion is 99% stenosed.  Origin to Prox Graft lesion is 100% stenosed.  A drug-eluting stent was successfully placed using a STENT RESOLUTE ONYX 2.5X15.  Post intervention, there is a 0% residual stenosis.  A drug-eluting stent was successfully placed using a STENT RESOLUTE ONYX 3.5X18.  Post intervention, there is a 0% residual stenosis.  Minx deployed to right femoral artery  Conclusion Successful intervention of tandem lesions 99% Mid body of SVG to diagonal  with a 2.5 x 15 mm and resolute Onyx to 21 atm 75% Proximal RCA deployment of a 3.5 x 18 mm resolute Onyx to 13 atm Both lesions were reduced to 0 TIMI-3 flow was maintained throughout the procedure No complications   CARDIAC CATHETERIZATION  Result Date: 09/18/2019  Ost Cx to Prox Cx lesion is 90% stenosed.  Mid LAD lesion is 100% stenosed.  Prox RCA lesion is 75% stenosed.  Mid Cx lesion is 100% stenosed.  Origin to Prox Graft lesion is 100% stenosed.  Prox Graft lesion is 99% stenosed.  Previously placed Mid RCA stent (unknown type) is widely patent.  LM normal LAD occluded proximal LCx-occluded mid RCA-75-80% proximal LIMA to lad-patent SVG-om chronically occluded SVG to al 99% proxiimal stenosis likely irv. No svg seen to rca.   CT ANGIO CHEST AORTA W/CM & OR WO/CM  Result Date: 09/17/2019 CLINICAL DATA:  Left chest pain and headache today. EXAM: CT ANGIOGRAPHY CHEST WITH AND WITHOUT CONTRAST TECHNIQUE: Multidetector CT imaging of the chest was performed using the standard protocol before and during bolus administration of intravenous contrast. Multiplanar CT image reconstructions and MIPs were obtained to evaluate the vascular anatomy. CONTRAST:  100 mL OMNIPAQUE IOHEXOL 350 MG/ML SOLN COMPARISON:  None. FINDINGS: Cardiovascular: Preferential  opacification of the thoracic aorta. No evidence of thoracic aortic aneurysm or dissection. Normal heart size. No pericardial effusion. The patient is status post CABG. Calcific aortic and coronary atherosclerosis noted. Mediastinum/Nodes: No enlarged mediastinal, hilar, or axillary lymph nodes. Thyroid gland, trachea, and esophagus demonstrate no significant findings. Lungs/Pleura: Lungs are clear. No pleural effusion or pneumothorax. Upper Abdomen: Negative. Musculoskeletal: No chest wall abnormality. No acute or significant osseous findings. Review of the MIP images confirms the above findings. IMPRESSION: Negative for aortic dissection or aneurysm.  No acute abnormality. Aortic Atherosclerosis (ICD10-I70.0). Electronically Signed   By: Inge Rise M.D.   On:  09/17/2019 12:34    Assessment & Plan    59 year old male with a past medical history significant for coronary artery disease s/p CABG and PCIs in [place], hypercholesterolemia, and hypertension who presented to the ED on 09/17/19 for an acute onset of chest pain pain, radiating down his left arm, with associated diaphoresis. Workup in the ED has been significant foranECG revealing sinus rhythm with no obvious acute ischemic changes, high sensitivity troponin 7 and 474respectively, chest xray negative for acute cardiopulmonary disease, and chest CT negative for a PE or an aortic dissection or aneurysm.  1.  Non-ST elevation myocardial infarction-patient ruled in for non-ST ovation myocardial infarction.  Underwent left cardiac catheterization which revealed normal left main, occluded proximal LAD, occluded proximal to mid left circumflex, 80 to 90% stenosis in the proximal RCA.  He had a patent LIMA graft to the LAD, patent LIMA graft to the large first day diagonal with a 99% stenosis in the graft.  He had an occluded vein graft to the OM and probable occluded vein graft to RCA.  He underwent PCI of the vein graft to the AL with great result.   Underwent PCI of the RCA native vessel with a good result.  Echo showed normal ejection fraction.  Patient is stable.  Is ready for discharge on Brilinta 90 mg twice daily, atorvastatin 80 mg daily, ramipril 10 mg daily.  Beta-blocker contraindicated to relative bradycardia.  Cardiac rehab has been ordered.  We will follow him up in our office in 1 week.  Signed, Javier Docker Kennidi Yoshida MD 09/19/2019, 8:17 AM  Pager: [phone]

## 2019-09-19 NOTE — Discharge Summary (Signed)
Physician Discharge Summary   Curtis Cooke  male DOB: 1960/02/23  YHC:[id number]  PCP: Dion Body, MD  Admit date: 09/17/2019 Discharge date: 09/19/2019  Admitted From: home Disposition:  home CODE STATUS: Full code  Discharge Instructions    AMB Referral to Cardiac Rehabilitation - Phase II   Complete by: As directed    Diagnosis: NSTEMI   After initial evaluation and assessments completed: Virtual Based Care may be provided alone or in conjunction with Phase 2 Cardiac Rehab based on patient barriers.: Yes   Amb Referral to Cardiac Rehabilitation   Complete by: As directed    Diagnosis: NSTEMI   After initial evaluation and assessments completed: Virtual Based Care may be provided alone or in conjunction with Phase 2 Cardiac Rehab based on patient barriers.: Yes   Discharge instructions   Complete by: As directed    Now you have cardiac stents, you need to take aspirin 81 and Brilinta together for a whole year to prevent stent clogging.  Last day 09/17/2020.  A printed prescription of Plavix is provided in case your insurance doesn't cover [place].  You will take either Brilinta (first choice) or Plavix, not both.  Your LDL (bad cholesterol) is 145, which is high considering that you have been taking your Lipitor 80 mg daily.  Please follow up with your cardiologist and see if you need more or different cholesterol medication.  You will follow up with cardiology as outpatient 1 week after discharge.   Dr. Enzo [facility] Clinics Course:  For full details, please see H&P, progress notes, consult notes and ancillary notes.  Briefly,  Curtis Cooke a 59 y.o.malewith medical history significant ofCAD s/p 5-vessel CABG in 2001, HTN who presented with left-sided chest pain.      # Left-sided chest pain 2/2 NSTEMI On presentation trop 7 then uptrended to 474. Chest pain relieved with nitro. pt was started on heparin gtt in the ED.  Pt underwent left heart cath  and received PCI of the saphenous vein graft to the anterolateral as well as PCI of the RCA native vessel.  Pt was loaded with Brilinta and continue with aspirin.  Pt will take dual-anti-plt therapy for a whole year.  Pt's LDL was 145 despite taking Lipitor 80 mg daily at home.  Pt continued on home statin, but advised to follow up with outpatient cardiology to see if pt needs more or different cholesterol-reducing medication.  Pt continued on home metop and ramipril at discharge.  # Hx of CAD s/p 5-vessel CABG in 2001 Pt had had no issues with chest pain until morning prior to presentation.  Per cardiology, pt "Underwent left cardiac catheterization which revealed normal left main, occluded proximal LAD, occluded proximal to mid left circumflex, 80 to 90% stenosis in the proximal RCA.  He had a patent LIMA graft to the LAD, patent LIMA graft to the large first day diagonal with a 99% stenosis in the graft.  He had an occluded vein graft to the OM and probable occluded vein graft to RCA.  He underwent PCI of the vein graft to the AL with great result.  Underwent PCI of the RCA native vessel with a good result."  Pt continued on home ASA 81, Lipitor 60m daily, metop and ramipril at discharge.  Pt prescribed Brilinta and ASA to take for a year.  # HTN Home metop held during hospitalization due to hypotension.  Home metop and ramipril continued at discharge.  #  Headache Resolved with migraine cocktail with IV benadryl and compazine   Discharge Diagnoses:  Active Problems:   Left-sided chest pain    Discharge Instructions:  Allergies as of 09/19/2019   No Known Allergies     Medication List    TAKE these medications   aspirin EC 81 MG tablet Take 81 mg by mouth daily.   atorvastatin 80 MG tablet Commonly known as: LIPITOR Take 1 tablet (80 mg total) by mouth daily.   Cholecalciferol 50 MCG (2000 UT) Caps Take 2,000 Units by mouth daily.   co-enzyme Q-10 30 MG capsule Take 300 mg  by mouth daily.   magnesium gluconate 500 MG tablet Commonly known as: MAGONATE Take 500 mg by mouth daily.   metoprolol tartrate 25 MG tablet Commonly known as: LOPRESSOR Take 0.5 tablets (12.5 mg total) by mouth 2 (two) times daily.   ramipril 10 MG capsule Commonly known as: ALTACE Take 1 capsule (10 mg total) by mouth daily.   ticagrelor 90 MG Tabs tablet Commonly known as: BRILINTA Take 1 tablet (90 mg total) by mouth 2 (two) times daily. Last day 09/17/2020.        Follow-up Information    Teodoro Spray, MD On 09/24/2019.   Specialty: Cardiology Why: Appointment at 3:45pm Contact information: [place] [address] [phone]        Dion Body, MD. Schedule an appointment as soon as possible for a visit on 09/25/2019.   Specialty: Family Medicine Why: Appointment at 11:00am Contact information: [place] [address] [phone]               No Known Allergies   The results of significant diagnostics from this hospitalization (including imaging, microbiology, ancillary and laboratory) are listed below for reference.   Consultations:   Procedures/Studies: DG Chest 2 View  Result Date: 09/17/2019 CLINICAL DATA:  Chest pain EXAM: CHEST - 2 VIEW COMPARISON:  None. FINDINGS: Normal heart size and mediastinal contours. Prior median sternotomy. No acute infiltrate or edema. No effusion or pneumothorax. No acute osseous findings. IMPRESSION: No active cardiopulmonary disease. Electronically Signed   By: Monte Fantasia M.D.   On: 09/17/2019 07:36   CARDIAC CATHETERIZATION  Result Date: 09/19/2019  Mid LAD lesion is 100% stenosed.  Ost Cx to Prox Cx lesion is 90% stenosed.  Mid Cx lesion is 100% stenosed.  Prox RCA lesion is 75% stenosed.  Non-stenotic Mid RCA lesion was previously treated.  Prox Graft lesion is 99% stenosed.  Origin to Prox Graft lesion is 100% stenosed.  A  drug-eluting stent was successfully placed using a STENT RESOLUTE ONYX 2.5X15.  Post intervention, there is a 0% residual stenosis.  A drug-eluting stent was successfully placed using a STENT RESOLUTE ONYX 3.5X18.  Post intervention, there is a 0% residual stenosis.  Minx deployed to right femoral artery  Conclusion Successful intervention of tandem lesions 99% Mid body of SVG to diagonal  with a 2.5 x 15 mm and resolute Onyx to 21 atm 75% Proximal RCA deployment of a 3.5 x 18 mm resolute Onyx to 13 atm Both lesions were reduced to 0 TIMI-3 flow was maintained throughout the procedure No complications   CARDIAC CATHETERIZATION  Result Date: 09/18/2019  Ost Cx to Prox Cx lesion is 90% stenosed.  Mid LAD lesion is 100% stenosed.  Prox RCA lesion is 75% stenosed.  Mid Cx lesion is 100% stenosed.  Origin to Prox Graft lesion is 100%  stenosed.  Prox Graft lesion is 99% stenosed.  Previously placed Mid RCA stent (unknown type) is widely patent.  LM normal LAD occluded proximal LCx-occluded mid RCA-75-80% proximal LIMA to lad-patent SVG-om chronically occluded SVG to al 99% proxiimal stenosis likely irv. No svg seen to rca.   CT ANGIO CHEST AORTA W/CM & OR WO/CM  Result Date: 09/17/2019 CLINICAL DATA:  Left chest pain and headache today. EXAM: CT ANGIOGRAPHY CHEST WITH AND WITHOUT CONTRAST TECHNIQUE: Multidetector CT imaging of the chest was performed using the standard protocol before and during bolus administration of intravenous contrast. Multiplanar CT image reconstructions and MIPs were obtained to evaluate the vascular anatomy. CONTRAST:  100 mL OMNIPAQUE IOHEXOL 350 MG/ML SOLN COMPARISON:  None. FINDINGS: Cardiovascular: Preferential opacification of the thoracic aorta. No evidence of thoracic aortic aneurysm or dissection. Normal heart size. No pericardial effusion. The patient is status post CABG. Calcific aortic and coronary atherosclerosis noted. Mediastinum/Nodes: No enlarged mediastinal,  hilar, or axillary lymph nodes. Thyroid gland, trachea, and esophagus demonstrate no significant findings. Lungs/Pleura: Lungs are clear. No pleural effusion or pneumothorax. Upper Abdomen: Negative. Musculoskeletal: No chest wall abnormality. No acute or significant osseous findings. Review of the MIP images confirms the above findings. IMPRESSION: Negative for aortic dissection or aneurysm.  No acute abnormality. Aortic Atherosclerosis (ICD10-I70.0). Electronically Signed   By: Inge Rise M.D.   On: 09/17/2019 12:34      Labs: BNP (last 3 results) No results for input(s): BNP in the last 8760 hours. Basic Metabolic Panel: Recent Labs  Lab 09/17/19 0647 09/18/19 0504 09/19/19 0522  NA 138 141 139  K 3.6 4.0 3.9  CL 103 104 104  CO2 24 28 25   GLUCOSE 120* 108* 109*  BUN 19 18 9   CREATININE 0.82 0.97 0.82  CALCIUM 9.0 9.1 8.7*  MG  --  2.1 1.9   Liver Function Tests: Recent Labs  Lab 09/17/19 0647  AST 20  ALT 25  ALKPHOS 34*  BILITOT 0.8  PROT 7.6  ALBUMIN 4.6   No results for input(s): LIPASE, AMYLASE in the last 168 hours. No results for input(s): AMMONIA in the last 168 hours. CBC: Recent Labs  Lab 09/17/19 0647 09/19/19 0522  WBC 7.8 10.3  NEUTROABS 4.0  --   HGB 14.8 14.0  HCT 41.4 40.2  MCV 85.4 87.8  PLT 252 222   Cardiac Enzymes: No results for input(s): CKTOTAL, CKMB, CKMBINDEX, TROPONINI in the last 168 hours. BNP: Invalid input(s): POCBNP CBG: No results for input(s): GLUCAP in the last 168 hours. D-Dimer No results for input(s): DDIMER in the last 72 hours. Hgb A1c Recent Labs    09/17/19 1555  HGBA1C 5.4   Lipid Profile Recent Labs    09/17/19 1555  CHOL 208*  HDL 30*  LDLCALC 145*  TRIG 167*  CHOLHDL 6.9   Thyroid function studies Recent Labs    09/17/19 1942  TSH 1.855   Anemia work up No results for input(s): VITAMINB12, FOLATE, FERRITIN, TIBC, IRON, RETICCTPCT in the last 72 hours. Urinalysis    Component Value  Date/Time   COLORURINE YELLOW (A) 03/09/2017 0858   APPEARANCEUR HAZY (A) 03/09/2017 0858   LABSPEC 1.017 03/09/2017 0858   PHURINE 5.0 03/09/2017 0858   GLUCOSEU NEGATIVE 03/09/2017 0858   HGBUR NEGATIVE 03/09/2017 0858   BILIRUBINUR NEGATIVE 03/09/2017 0858   KETONESUR 80 (A) 03/09/2017 0858   PROTEINUR 30 (A) 03/09/2017 0858   NITRITE NEGATIVE 03/09/2017 0858   LEUKOCYTESUR NEGATIVE 03/09/2017 9323  Sepsis Labs Invalid input(s): PROCALCITONIN,  WBC,  LACTICIDVEN Microbiology Recent Results (from the past 240 hour(s))  SARS Coronavirus 2 by RT PCR (hospital order, performed in [facility] hospital lab) Nasopharyngeal Nasopharyngeal Swab     Status: None   Collection Time: 09/17/19 11:39 AM   Specimen: Nasopharyngeal Swab  Result Value Ref Range Status   SARS Coronavirus 2 NEGATIVE NEGATIVE Final    Comment: (NOTE) SARS-CoV-2 target nucleic acids are NOT DETECTED.  The SARS-CoV-2 RNA is generally detectable in upper and lower respiratory specimens during the acute phase of infection. The lowest concentration of SARS-CoV-2 viral copies this assay can detect is 250 copies / mL. A negative result does not preclude SARS-CoV-2 infection and should not be used as the sole basis for treatment or other patient management decisions.  A negative result may occur with improper specimen collection / handling, submission of specimen other than nasopharyngeal swab, presence of viral mutation(s) within the areas targeted by this assay, and inadequate number of viral copies (<250 copies / mL). A negative result must be combined with clinical observations, patient history, and epidemiological information.  Fact Sheet for Patients:   StrictlyIdeas.no  Fact Sheet for Healthcare Providers: BankingDealers.co.za  This test is not yet approved or  cleared by the [place] FDA and has been authorized for detection and/or diagnosis of SARS-CoV-2  by FDA under an Emergency Use Authorization (EUA).  This EUA will remain in effect (meaning this test can be used) for the duration of the COVID-19 declaration under Section 564(b)(1) of the Act, 21 U.S.C. section 360bbb-3(b)(1), unless the authorization is terminated or revoked sooner.  Performed at [facility], [address] [place], [place] [address]      Total time spend on discharging this patient, including the last patient exam, discussing the hospital stay, instructions for ongoing care as it relates to all pertinent caregivers, as well as preparing the medical discharge records, prescriptions, and/or referrals as applicable, is 45 minutes.    Enzo Bi, MD  [facility] 09/19/2019, 9:48 AM  If 7PM-7AM, please contact night-coverage

## 2019-09-21 LAB — ECHOCARDIOGRAM COMPLETE
AR max vel: 3.19 cm2
AV Area VTI: 3.12 cm2
AV Area mean vel: 3.02 cm2
AV Mean grad: 2 mmHg
AV Peak grad: 3.1 mmHg
Ao pk vel: 0.88 m/s
Area-P 1/2: 3.91 cm2
Height: 67 in
S' Lateral: 3.98 cm
Weight: 3696 oz

## 2019-09-22 ENCOUNTER — Encounter: Payer: Self-pay | Admitting: Cardiology

## 2021-07-24 ENCOUNTER — Emergency Department: Payer: 59

## 2021-07-24 ENCOUNTER — Other Ambulatory Visit: Payer: Self-pay

## 2021-07-24 DIAGNOSIS — Z79899 Other long term (current) drug therapy: Secondary | ICD-10-CM | POA: Insufficient documentation

## 2021-07-24 DIAGNOSIS — R079 Chest pain, unspecified: Secondary | ICD-10-CM | POA: Insufficient documentation

## 2021-07-24 DIAGNOSIS — I10 Essential (primary) hypertension: Secondary | ICD-10-CM | POA: Insufficient documentation

## 2021-07-24 DIAGNOSIS — Z951 Presence of aortocoronary bypass graft: Secondary | ICD-10-CM | POA: Insufficient documentation

## 2021-07-24 DIAGNOSIS — I249 Acute ischemic heart disease, unspecified: Secondary | ICD-10-CM | POA: Diagnosis not present

## 2021-07-24 DIAGNOSIS — I214 Non-ST elevation (NSTEMI) myocardial infarction: Secondary | ICD-10-CM | POA: Diagnosis not present

## 2021-07-24 DIAGNOSIS — Z7982 Long term (current) use of aspirin: Secondary | ICD-10-CM | POA: Insufficient documentation

## 2021-07-24 DIAGNOSIS — I2510 Atherosclerotic heart disease of native coronary artery without angina pectoris: Secondary | ICD-10-CM | POA: Insufficient documentation

## 2021-07-24 LAB — CBC
HCT: 43.6 % (ref 39.0–52.0)
Hemoglobin: 14.4 g/dL (ref 13.0–17.0)
MCH: 30.3 pg (ref 26.0–34.0)
MCHC: 33 g/dL (ref 30.0–36.0)
MCV: 91.8 fL (ref 80.0–100.0)
Platelets: 267 10*3/uL (ref 150–400)
RBC: 4.75 MIL/uL (ref 4.22–5.81)
RDW: 12.8 % (ref 11.5–15.5)
WBC: 7.4 10*3/uL (ref 4.0–10.5)
nRBC: 0 % (ref 0.0–0.2)

## 2021-07-24 NOTE — ED Triage Notes (Signed)
Pt c/o chest pressure x 2 days. Left side, with left arm numbness and tingling. Extensive cardiac hx. Sx feel similar in nature to previous Mis.

## 2021-07-25 ENCOUNTER — Emergency Department
Admission: EM | Admit: 2021-07-25 | Discharge: 2021-07-25 | Disposition: A | Discharge status: Home / Self Care | Payer: 59 | Source: Home / Self Care | Attending: Emergency Medicine | Admitting: Emergency Medicine

## 2021-07-25 ENCOUNTER — Other Ambulatory Visit: Payer: Self-pay

## 2021-07-25 DIAGNOSIS — R079 Chest pain, unspecified: Secondary | ICD-10-CM

## 2021-07-25 LAB — BASIC METABOLIC PANEL
Anion gap: 3 — ABNORMAL LOW (ref 5–15)
BUN: 17 mg/dL (ref 8–23)
CO2: 29 mmol/L (ref 22–32)
Calcium: 9 mg/dL (ref 8.9–10.3)
Chloride: 107 mmol/L (ref 98–111)
Creatinine, Ser: 1.05 mg/dL (ref 0.61–1.24)
GFR, Estimated: >60 mL/min (ref >60)
Glucose, Bld: 159 mg/dL — ABNORMAL HIGH (ref 70–99)
Potassium: 4 mmol/L (ref 3.5–5.1)
Sodium: 139 mmol/L (ref 135–145)

## 2021-07-25 LAB — TROPONIN I (HIGH SENSITIVITY)
Troponin I (High Sensitivity): 7 ng/L (ref <18)
Troponin I (High Sensitivity): 7 ng/L (ref <18)

## 2021-07-25 NOTE — ED Provider Notes (Signed)
[facility] Provider Note    Event Date/Time   First MD Initiated Contact with Patient 07/25/21 0101     (approximate)   History   Chest Pain   HPI  Curtis Cooke is a 61 y.o. male with history of coronary artery disease status post CABG, hypertension who presents to the emergency department with left-sided chest pressure that has been intermittent over the past 2 days.  Not having any symptoms currently.  Has had some shortness of breath with it but states this feels similar to gas.  No nausea, vomiting, diaphoresis, dizziness, lower extremity swelling or pain, fevers or cough.  No aggravating or alleviating factors.  States due to his cardiac history he became concerned and that is why he presented to the emergency department.   History provided by patient and wife.    Past Medical History:  Diagnosis Date   Coronary artery disease    Hypertension     Past Surgical History:  Procedure Laterality Date   CARDIAC SURGERY     CORONARY STENT INTERVENTION N/A 09/18/2019   Procedure: CORONARY STENT INTERVENTION;  Surgeon: Alwyn Peaallwood, Dwayne D, MD;  Location: [facility] INVASIVE CV LAB;  Service: Cardiovascular;  Laterality: N/A;   LEFT HEART CATH AND CORS/GRAFTS ANGIOGRAPHY N/A 09/18/2019   Procedure: LEFT HEART CATH AND CORS/GRAFTS ANGIOGRAPHY;  Surgeon: Dalia HeadingFath, Kenneth A, MD;  Location: [facility] INVASIVE CV LAB;  Service: Cardiovascular;  Laterality: N/A;    MEDICATIONS:  Prior to Admission medications   Medication Sig Start Date End Date Taking? Authorizing Provider  aspirin EC 81 MG tablet Take 81 mg by mouth daily.    [provider]  atorvastatin (LIPITOR) 80 MG tablet Take 1 tablet (80 mg total) by mouth daily. 09/19/19 12/18/19  Darlin PriestlyLai, Tina, MD  Cholecalciferol 50 MCG (2000 UT) CAPS Take 2,000 Units by mouth daily.    [provider]  co-enzyme Q-10 30 MG capsule Take 300 mg by mouth daily.    [provider]  magnesium gluconate (MAGONATE)  500 MG tablet Take 500 mg by mouth daily.     [provider]  metoprolol tartrate (LOPRESSOR) 25 MG tablet Take 0.5 tablets (12.5 mg total) by mouth 2 (two) times daily. 09/19/19 12/18/19  Darlin PriestlyLai, Tina, MD  ramipril (ALTACE) 10 MG capsule Take 1 capsule (10 mg total) by mouth daily. 09/19/19 12/18/19  Darlin PriestlyLai, Tina, MD    Physical Exam   Triage Vital Signs: ED Triage Vitals  Enc Vitals Group     BP 07/24/21 2327 (!) 150/88     Pulse Rate 07/24/21 2323 74     Resp 07/24/21 2323 17     Temp 07/24/21 2323 98.4 F (36.9 C)     Temp Source 07/24/21 2323 Oral     SpO2 07/24/21 2323 97 %     Weight 07/24/21 2328 220 lb (99.8 kg)     Height 07/24/21 2328 5\' 7"  (1.702 m)     Head Circumference --      Peak Flow --      Pain Score --      Pain Loc --      Pain Edu? --      Excl. in GC? --     Most recent vital signs: Vitals:   07/25/21 0045 07/25/21 0230  BP: (!) 147/82 (!) 150/87  Pulse: 69 70  Resp: (!) 21 20  Temp:  98.1 F (36.7 C)  SpO2: 98% 100%    CONSTITUTIONAL: Alert and oriented  and responds appropriately to questions. Well-appearing; well-nourished HEAD: Normocephalic, atraumatic EYES: Conjunctivae clear, pupils appear equal, sclera nonicteric ENT: normal nose; moist mucous membranes NECK: Supple, normal ROM CARD: RRR; S1 and S2 appreciated; no murmurs, no clicks, no rubs, no gallops RESP: Normal chest excursion without splinting or tachypnea; breath sounds clear and equal bilaterally; no wheezes, no rhonchi, no rales, no hypoxia or respiratory distress, speaking full sentences ABD/GI: Normal bowel sounds; non-distended; soft, non-tender, no rebound, no guarding, no peritoneal signs BACK: The back appears normal EXT: Normal ROM in all joints; no deformity noted, no edema; no cyanosis, no calf tenderness or calf swelling SKIN: Normal color for age and race; warm; no rash on exposed skin NEURO: Moves all extremities equally, normal speech PSYCH: The patient's mood and  manner are appropriate.   ED Results / Procedures / Treatments   LABS: (all labs ordered are listed, but only abnormal results are displayed) Labs Reviewed  BASIC METABOLIC PANEL - Abnormal; Notable for the following components:      Result Value   Glucose, Bld 159 (*)    Anion gap 3 (*)    All other components within normal limits  CBC  TROPONIN I (HIGH SENSITIVITY)  TROPONIN I (HIGH SENSITIVITY)     EKG:  EKG Interpretation  Date/Time:  Sunday July 24 2021 23:21:51 EDT Ventricular Rate:  79 PR Interval:  152 QRS Duration: 86 QT Interval:  400 QTC Calculation: 458 R Axis:   22 Text Interpretation: Sinus rhythm with Premature atrial complexes Inferior infarct , age undetermined Abnormal ECG When compared with ECG of 19-Sep-2019 05:27, Premature atrial complexes are now Present Inferior infarct is now Present T wave inversion no longer evident in Lateral leads Confirmed by Rochele Raring [phone]) on 07/25/2021 1:15:20 AM           RADIOLOGY: My personal review and interpretation of imaging: Chest x-ray clear.  I have personally reviewed all radiology reports.   DG Chest 1 View  Result Date: 07/24/2021 CLINICAL DATA:  Left-sided chest pressure x2 days. EXAM: CHEST  1 VIEW COMPARISON:  September 17, 2019 FINDINGS: Multiple sternal wires and vascular clips are seen. The heart size and mediastinal contours are within normal limits. There is no evidence of acute infiltrate, pleural effusion or pneumothorax. The visualized skeletal structures are unremarkable. IMPRESSION: 1. Evidence of prior median sternotomy/CABG. 2. No acute or active cardiopulmonary disease. Electronically Signed   By: Aram Candela M.D.   On: 07/24/2021 23:45     PROCEDURES:  Critical Care performed: No      .1-3 Lead EKG Interpretation  Performed by: Idabelle Mcpeters, Layla Maw, DO Authorized by: Cason Luffman, Layla Maw, DO     Interpretation: normal     ECG rate:  69   ECG rate assessment: normal     Rhythm:  sinus rhythm     Ectopy: none     Conduction: normal       IMPRESSION / MDM / ASSESSMENT AND PLAN / ED COURSE  I reviewed the triage vital signs and the nursing notes.    Patient here with chest pressure, shortness of breath.  Now currently asymptomatic.  History of cardiac disease.  The patient is on the cardiac monitor to evaluate for evidence of arrhythmia and/or significant heart rate changes.   DIFFERENTIAL DIAGNOSIS (includes but not limited to):   ACS, PE, dissection, GERD, esophagitis, esophageal spasm, gas, pneumonia, pneumothorax, CHF   Patient's presentation is most consistent with acute presentation with potential threat to life  or bodily function.   PLAN: We will obtain CBC, BMP, troponin x2, chest x-ray, EKG.  Patient currently asymptomatic.  Will monitor on cardiac monitoring.   MEDICATIONS GIVEN IN ED: Medications - No data to display   ED COURSE: Patient's labs reassuring.  Normal hemoglobin, electrolytes, renal function.  Troponin x2 negative.  EKG is nonischemic.  Chest x-ray reviewed/interpreted by myself radiologist and shows no infiltrate, edema or pneumothorax.  No events noted on cardiac monitoring.  Patient continues to be asymptomatic.  We have discussed admission to the hospital for observation given his significant cardiac history however patient states that he would prefer to go home and is comfortable with plan for close follow-up with his cardiologist as an outpatient tomorrow morning.   At this time, I do not feel there is any life-threatening condition present. I reviewed all nursing notes, vitals, pertinent previous records.  All lab and urine results, EKGs, imaging ordered have been independently reviewed and interpreted by myself.  I reviewed all available radiology reports from any imaging ordered this visit.  Based on my assessment, I feel the patient is safe to be discharged home without further emergent workup and can continue workup as an  outpatient as needed. Discussed all findings, treatment plan as well as usual and customary return precautions with patient and wife.  They verbalize understanding and are comfortable with this plan.  Outpatient follow-up has been provided as needed.  All questions have been answered.    CONSULTS: Admission offered given patient's multiple risk factors for ACS and previous history but he would prefer discharge home and cardiac work-up today is reassuring.  Patient asymptomatic and hemodynamically stable.   OUTSIDE RECORDS REVIEWED: Reviewed patient's last internal medicine note on 02/25/2021.       FINAL CLINICAL IMPRESSION(S) / ED DIAGNOSES   Final diagnoses:  Nonspecific chest pain     Rx / DC Orders   ED Discharge Orders     None        Note:  This document was prepared using Dragon voice recognition software and may include unintentional dictation errors.   Nussen Pullin, Layla Maw, DO 07/25/21 [phone]

## 2021-07-25 NOTE — Discharge Instructions (Signed)
I recommend contacting your cardiologist on Monday morning for close outpatient follow-up.

## 2021-07-27 ENCOUNTER — Encounter
Admission: EM | Disposition: A | Discharge status: Home / Self Care | Payer: Self-pay | Source: Home / Self Care | Attending: Internal Medicine

## 2021-07-27 ENCOUNTER — Emergency Department: Payer: 59

## 2021-07-27 ENCOUNTER — Encounter: Payer: Self-pay | Admitting: Medical Oncology

## 2021-07-27 ENCOUNTER — Inpatient Hospital Stay
Admission: EM | Admit: 2021-07-27 | Discharge: 2021-07-29 | DRG: 282 | Disposition: A | Discharge status: Home / Self Care | Payer: 59 | Attending: Internal Medicine | Admitting: Internal Medicine

## 2021-07-27 ENCOUNTER — Other Ambulatory Visit: Payer: Self-pay

## 2021-07-27 DIAGNOSIS — E785 Hyperlipidemia, unspecified: Secondary | ICD-10-CM | POA: Diagnosis present

## 2021-07-27 DIAGNOSIS — R079 Chest pain, unspecified: Secondary | ICD-10-CM | POA: Diagnosis present

## 2021-07-27 DIAGNOSIS — Z6834 Body mass index (BMI) 34.0-34.9, adult: Secondary | ICD-10-CM

## 2021-07-27 DIAGNOSIS — I214 Non-ST elevation (NSTEMI) myocardial infarction: Principal | ICD-10-CM | POA: Diagnosis present

## 2021-07-27 DIAGNOSIS — I10 Essential (primary) hypertension: Secondary | ICD-10-CM | POA: Diagnosis present

## 2021-07-27 DIAGNOSIS — Z8249 Family history of ischemic heart disease and other diseases of the circulatory system: Secondary | ICD-10-CM | POA: Diagnosis not present

## 2021-07-27 DIAGNOSIS — Z79899 Other long term (current) drug therapy: Secondary | ICD-10-CM | POA: Diagnosis not present

## 2021-07-27 DIAGNOSIS — Z955 Presence of coronary angioplasty implant and graft: Secondary | ICD-10-CM

## 2021-07-27 DIAGNOSIS — Z7982 Long term (current) use of aspirin: Secondary | ICD-10-CM

## 2021-07-27 DIAGNOSIS — I249 Acute ischemic heart disease, unspecified: Secondary | ICD-10-CM | POA: Diagnosis present

## 2021-07-27 DIAGNOSIS — I25700 Atherosclerosis of coronary artery bypass graft(s), unspecified, with unstable angina pectoris: Secondary | ICD-10-CM | POA: Diagnosis present

## 2021-07-27 DIAGNOSIS — E669 Obesity, unspecified: Secondary | ICD-10-CM | POA: Diagnosis present

## 2021-07-27 DIAGNOSIS — I25810 Atherosclerosis of coronary artery bypass graft(s) without angina pectoris: Secondary | ICD-10-CM | POA: Diagnosis present

## 2021-07-27 HISTORY — PX: LEFT HEART CATH AND CORONARY ANGIOGRAPHY: CATH118249

## 2021-07-27 LAB — CBC WITH DIFFERENTIAL/PLATELET
Abs Immature Granulocytes: 0.05 10*3/uL (ref 0.00–0.07)
Basophils Absolute: 0.1 10*3/uL (ref 0.0–0.1)
Basophils Relative: 1 %
Eosinophils Absolute: 0.1 10*3/uL (ref 0.0–0.5)
Eosinophils Relative: 1 %
HCT: 45.9 % (ref 39.0–52.0)
Hemoglobin: 15.2 g/dL (ref 13.0–17.0)
Immature Granulocytes: 0 %
Lymphocytes Relative: 22 %
Lymphs Abs: 2.6 10*3/uL (ref 0.7–4.0)
MCH: 30.2 pg (ref 26.0–34.0)
MCHC: 33.1 g/dL (ref 30.0–36.0)
MCV: 91.3 fL (ref 80.0–100.0)
Monocytes Absolute: 0.8 10*3/uL (ref 0.1–1.0)
Monocytes Relative: 7 %
Neutro Abs: 8.3 10*3/uL — ABNORMAL HIGH (ref 1.7–7.7)
Neutrophils Relative %: 69 %
Platelets: 295 10*3/uL (ref 150–400)
RBC: 5.03 MIL/uL (ref 4.22–5.81)
RDW: 12.4 % (ref 11.5–15.5)
WBC: 12 10*3/uL — ABNORMAL HIGH (ref 4.0–10.5)
nRBC: 0 % (ref 0.0–0.2)

## 2021-07-27 LAB — COMPREHENSIVE METABOLIC PANEL
ALT: 32 U/L (ref 0–44)
AST: 55 U/L — ABNORMAL HIGH (ref 15–41)
Albumin: 4.3 g/dL (ref 3.5–5.0)
Alkaline Phosphatase: 26 U/L — ABNORMAL LOW (ref 38–126)
Anion gap: 11 (ref 5–15)
BUN: 18 mg/dL (ref 8–23)
CO2: 20 mmol/L — ABNORMAL LOW (ref 22–32)
Calcium: 9.3 mg/dL (ref 8.9–10.3)
Chloride: 103 mmol/L (ref 98–111)
Creatinine, Ser: 1.14 mg/dL (ref 0.61–1.24)
GFR, Estimated: >60 mL/min (ref >60)
Glucose, Bld: 115 mg/dL — ABNORMAL HIGH (ref 70–99)
Potassium: 4 mmol/L (ref 3.5–5.1)
Sodium: 134 mmol/L — ABNORMAL LOW (ref 135–145)
Total Bilirubin: 1.3 mg/dL — ABNORMAL HIGH (ref 0.3–1.2)
Total Protein: 7.5 g/dL (ref 6.5–8.1)

## 2021-07-27 LAB — TROPONIN I (HIGH SENSITIVITY)
Troponin I (High Sensitivity): 3242 ng/L (ref <18)
Troponin I (High Sensitivity): 3033 ng/L (ref <18)

## 2021-07-27 LAB — APTT: aPTT: 26 seconds (ref 24–36)

## 2021-07-27 LAB — BRAIN NATRIURETIC PEPTIDE: B Natriuretic Peptide: 71.2 pg/mL (ref 0.0–100.0)

## 2021-07-27 LAB — CARDIAC CATHETERIZATION: Cath EF Quantitative: 47 %

## 2021-07-27 SURGERY — LEFT HEART CATH AND CORONARY ANGIOGRAPHY
Anesthesia: Moderate Sedation

## 2021-07-27 MED ORDER — IOHEXOL 300 MG/ML  SOLN
INTRAMUSCULAR | Status: DC | PRN
  Administered 2021-07-27: 120 mL

## 2021-07-27 MED ORDER — ISOSORBIDE MONONITRATE ER 60 MG PO TB24
60.0000 mg | ORAL_TABLET | Freq: Every day | ORAL | Status: DC
  Administered 2021-07-28 – 2021-07-29 (×2): 60 mg via ORAL
  Filled 2021-07-27 (×2): qty 1

## 2021-07-27 MED ORDER — SODIUM CHLORIDE 0.9% FLUSH
3.0000 mL | Freq: Two times a day (BID) | INTRAVENOUS | Status: DC
  Administered 2021-07-28: 3 mL via INTRAVENOUS

## 2021-07-27 MED ORDER — ASPIRIN 81 MG PO CHEW
81.0000 mg | CHEWABLE_TABLET | Freq: Every day | ORAL | Status: DC
  Administered 2021-07-28 – 2021-07-29 (×2): 81 mg via ORAL
  Filled 2021-07-27 (×2): qty 1

## 2021-07-27 MED ORDER — MORPHINE SULFATE (PF) 2 MG/ML IV SOLN
2.0000 mg | INTRAVENOUS | Status: DC | PRN
Start: 2021-07-27 — End: 2021-07-27

## 2021-07-27 MED ORDER — ASPIRIN 81 MG PO CHEW: 81.0000 mg | CHEWABLE_TABLET | ORAL | Status: DC

## 2021-07-27 MED ORDER — ASPIRIN 81 MG PO TBEC: 81.0000 mg | DELAYED_RELEASE_TABLET | Freq: Every day | ORAL | Status: DC

## 2021-07-27 MED ORDER — SODIUM CHLORIDE 0.9% FLUSH
3.0000 mL | Freq: Two times a day (BID) | INTRAVENOUS | Status: DC
  Administered 2021-07-28 (×2): 3 mL via INTRAVENOUS

## 2021-07-27 MED ORDER — MIDAZOLAM HCL 2 MG/2ML IJ SOLN
INTRAMUSCULAR | Status: DC | PRN
  Administered 2021-07-27 (×2): 1 mg via INTRAVENOUS

## 2021-07-27 MED ORDER — LABETALOL HCL 5 MG/ML IV SOLN
10.0000 mg | INTRAVENOUS | Status: AC | PRN
Start: 2021-07-27 — End: 2021-07-28

## 2021-07-27 MED ORDER — MIDAZOLAM HCL 2 MG/2ML IJ SOLN
INTRAMUSCULAR | Status: AC
  Filled 2021-07-27: qty 2

## 2021-07-27 MED ORDER — VITAMIN D3 25 MCG (1000 UNIT) PO TABS
2000.0000 [IU] | ORAL_TABLET | Freq: Every day | ORAL | Status: DC
  Administered 2021-07-28 – 2021-07-29 (×2): 2000 [IU] via ORAL
  Filled 2021-07-27 (×4): qty 2

## 2021-07-27 MED ORDER — HEPARIN (PORCINE) IN NACL 1000-0.9 UT/500ML-% IV SOLN
INTRAVENOUS | Status: AC
  Filled 2021-07-27: qty 1000

## 2021-07-27 MED ORDER — NITROGLYCERIN 2 % TD OINT
TOPICAL_OINTMENT | TRANSDERMAL | Status: DC | PRN
  Administered 2021-07-27: 1 [in_us] via TOPICAL

## 2021-07-27 MED ORDER — ACETAMINOPHEN 325 MG PO TABS: 650.0000 mg | ORAL_TABLET | ORAL | Status: DC | PRN

## 2021-07-27 MED ORDER — FENTANYL CITRATE (PF) 100 MCG/2ML IJ SOLN
INTRAMUSCULAR | Status: DC | PRN
  Administered 2021-07-27: 25 ug via INTRAVENOUS
  Administered 2021-07-27: 50 ug via INTRAVENOUS
  Administered 2021-07-27: 25 ug via INTRAVENOUS

## 2021-07-27 MED ORDER — ONDANSETRON HCL 4 MG/2ML IJ SOLN: 4.0000 mg | Freq: Four times a day (QID) | INTRAMUSCULAR | Status: DC | PRN

## 2021-07-27 MED ORDER — HEPARIN (PORCINE) 25000 UT/250ML-% IV SOLN
1150.0000 [IU]/h | INTRAVENOUS | Status: DC
Start: 2021-07-27 — End: 2021-07-27
  Administered 2021-07-27: 1150 [IU]/h via INTRAVENOUS
  Filled 2021-07-27: qty 250

## 2021-07-27 MED ORDER — LIDOCAINE HCL (PF) 1 % IJ SOLN
INTRAMUSCULAR | Status: DC | PRN
  Administered 2021-07-27: 20 mL

## 2021-07-27 MED ORDER — DIPHENHYDRAMINE HCL 25 MG PO CAPS
25.0000 mg | ORAL_CAPSULE | Freq: Once | ORAL | Status: AC
Start: 2021-07-28 — End: 2021-07-28
  Administered 2021-07-28: 25 mg via ORAL
  Filled 2021-07-27: qty 1

## 2021-07-27 MED ORDER — SODIUM CHLORIDE 0.9 % IV BOLUS
1000.0000 mL | Freq: Once | INTRAVENOUS | Status: AC
  Administered 2021-07-27: 1000 mL via INTRAVENOUS

## 2021-07-27 MED ORDER — LIDOCAINE HCL 1 % IJ SOLN
INTRAMUSCULAR | Status: AC
  Filled 2021-07-27: qty 20

## 2021-07-27 MED ORDER — CLOPIDOGREL BISULFATE 75 MG PO TABS
75.0000 mg | ORAL_TABLET | Freq: Every day | ORAL | Status: DC
  Administered 2021-07-28 – 2021-07-29 (×2): 75 mg via ORAL
  Filled 2021-07-27 (×2): qty 1

## 2021-07-27 MED ORDER — ATORVASTATIN CALCIUM 80 MG PO TABS
80.0000 mg | ORAL_TABLET | Freq: Every day | ORAL | Status: DC
  Administered 2021-07-27: 80 mg via ORAL
  Filled 2021-07-27: qty 1

## 2021-07-27 MED ORDER — IOHEXOL 350 MG/ML SOLN
75.0000 mL | Freq: Once | INTRAVENOUS | Status: AC | PRN
  Administered 2021-07-27: 75 mL via INTRAVENOUS

## 2021-07-27 MED ORDER — HYDRALAZINE HCL 20 MG/ML IJ SOLN: 10.0000 mg | INTRAMUSCULAR | Status: AC | PRN

## 2021-07-27 MED ORDER — SODIUM CHLORIDE 0.9% FLUSH
3.0000 mL | INTRAVENOUS | Status: DC | PRN
Start: 2021-07-28 — End: 2021-07-29

## 2021-07-27 MED ORDER — ACETAMINOPHEN 650 MG RE SUPP: 650.0000 mg | Freq: Four times a day (QID) | RECTAL | Status: DC | PRN

## 2021-07-27 MED ORDER — SODIUM CHLORIDE 0.9 % IV SOLN: 250.0000 mL | INTRAVENOUS | Status: DC | PRN

## 2021-07-27 MED ORDER — ASPIRIN 81 MG PO CHEW
324.0000 mg | CHEWABLE_TABLET | Freq: Once | ORAL | Status: AC
  Administered 2021-07-27: 324 mg via ORAL
  Filled 2021-07-27: qty 4

## 2021-07-27 MED ORDER — METOPROLOL TARTRATE 5 MG/5ML IV SOLN: 5.0000 mg | INTRAVENOUS | Status: DC | PRN

## 2021-07-27 MED ORDER — FENTANYL CITRATE PF 50 MCG/ML IJ SOSY
50.0000 ug | PREFILLED_SYRINGE | INTRAMUSCULAR | Status: DC | PRN
  Administered 2021-07-27: 50 ug via INTRAVENOUS
  Filled 2021-07-27: qty 1

## 2021-07-27 MED ORDER — HEPARIN (PORCINE) IN NACL 1000-0.9 UT/500ML-% IV SOLN
INTRAVENOUS | Status: DC | PRN
  Administered 2021-07-27 (×2): 500 mL

## 2021-07-27 MED ORDER — MORPHINE SULFATE (PF) 2 MG/ML IV SOLN
2.0000 mg | INTRAVENOUS | Status: DC | PRN
  Administered 2021-07-28: 2 mg via INTRAVENOUS
  Filled 2021-07-27: qty 1

## 2021-07-27 MED ORDER — HYDRALAZINE HCL 10 MG PO TABS: 10.0000 mg | ORAL_TABLET | Freq: Four times a day (QID) | ORAL | Status: DC | PRN

## 2021-07-27 MED ORDER — SODIUM CHLORIDE 0.9 % WEIGHT BASED INFUSION
1.0000 mL/kg/h | INTRAVENOUS | Status: AC
  Administered 2021-07-27: 1 mL/kg/h via INTRAVENOUS

## 2021-07-27 MED ORDER — NITROGLYCERIN 0.4 MG SL SUBL
SUBLINGUAL_TABLET | SUBLINGUAL | Status: AC
  Filled 2021-07-27: qty 1

## 2021-07-27 MED ORDER — ACETAMINOPHEN 325 MG PO TABS
650.0000 mg | ORAL_TABLET | Freq: Four times a day (QID) | ORAL | Status: DC | PRN
  Administered 2021-07-28: 650 mg via ORAL
  Filled 2021-07-27: qty 2

## 2021-07-27 MED ORDER — FENTANYL CITRATE (PF) 100 MCG/2ML IJ SOLN
INTRAMUSCULAR | Status: AC
  Filled 2021-07-27: qty 2

## 2021-07-27 MED ORDER — SODIUM CHLORIDE 0.9% FLUSH: 3.0000 mL | INTRAVENOUS | Status: DC | PRN

## 2021-07-27 MED ORDER — NITROGLYCERIN 2 % TD OINT
TOPICAL_OINTMENT | TRANSDERMAL | Status: AC
  Filled 2021-07-27: qty 1

## 2021-07-27 MED ORDER — METOPROLOL TARTRATE 25 MG PO TABS: 12.5000 mg | ORAL_TABLET | Freq: Two times a day (BID) | ORAL | Status: DC

## 2021-07-27 MED ORDER — RAMIPRIL 10 MG PO CAPS
10.0000 mg | ORAL_CAPSULE | Freq: Every day | ORAL | Status: DC
  Administered 2021-07-28 – 2021-07-29 (×2): 10 mg via ORAL
  Filled 2021-07-27 (×2): qty 1

## 2021-07-27 MED ORDER — POLYETHYLENE GLYCOL 3350 17 G PO PACK
17.0000 g | PACK | Freq: Every day | ORAL | Status: DC | PRN
Start: 2021-07-27 — End: 2021-07-29

## 2021-07-27 MED ORDER — HEPARIN BOLUS VIA INFUSION
4000.0000 [IU] | Freq: Once | INTRAVENOUS | Status: AC
Start: 2021-07-27 — End: 2021-07-27
  Administered 2021-07-27: 4000 [IU] via INTRAVENOUS
  Filled 2021-07-27: qty 4000

## 2021-07-27 MED ORDER — NITROGLYCERIN 0.4 MG SL SUBL
SUBLINGUAL_TABLET | SUBLINGUAL | Status: DC | PRN
  Administered 2021-07-27: .4 mg via SUBLINGUAL

## 2021-07-27 MED ORDER — SODIUM CHLORIDE 0.9 % IV SOLN: INTRAVENOUS | Status: DC

## 2021-07-27 MED ORDER — FENTANYL CITRATE PF 50 MCG/ML IJ SOSY
50.0000 ug | PREFILLED_SYRINGE | INTRAMUSCULAR | Status: AC | PRN
  Administered 2021-07-27 – 2021-07-28 (×3): 50 ug via INTRAVENOUS
  Filled 2021-07-27 (×3): qty 1

## 2021-07-27 MED ORDER — METOPROLOL TARTRATE 25 MG PO TABS: 25.0000 mg | ORAL_TABLET | Freq: Two times a day (BID) | ORAL | Status: DC

## 2021-07-27 MED ORDER — ONDANSETRON HCL 4 MG PO TABS: 4.0000 mg | ORAL_TABLET | Freq: Four times a day (QID) | ORAL | Status: DC | PRN

## 2021-07-27 SURGICAL SUPPLY — 12 items
CATH INFINITI 5 FR IM (CATHETERS) IMPLANT
CATH INFINITI 5FR MULTPACK ANG (CATHETERS) ×1 IMPLANT
DEVICE CLOSURE MYNXGRIP 5F (Vascular Products) ×1 IMPLANT
NDL PERC 18GX7CM (NEEDLE) IMPLANT
NEEDLE PERC 18GX7CM (NEEDLE) ×2 IMPLANT
PACK CARDIAC CATH (CUSTOM PROCEDURE TRAY) ×2 IMPLANT
PROTECTION STATION PRESSURIZED (MISCELLANEOUS) ×2
SET ATX SIMPLICITY (MISCELLANEOUS) ×1 IMPLANT
SHEATH AVANTI 5FR X 11CM (SHEATH) ×1 IMPLANT
STATION PROTECTION PRESSURIZED (MISCELLANEOUS) IMPLANT
WIRE EMERALD 3MM-J .035X260CM (WIRE) IMPLANT
WIRE GUIDERIGHT .035X150 (WIRE) ×1 IMPLANT

## 2021-07-27 NOTE — H&P (Signed)
History and Physical   Curtis Cooke EHO:[id number] DOB: 1960/08/09 DOA: 07/27/2021  PCP: Marisue Ivan, MD  Outpatient Specialists: [facility]. [facility] cardiology Patient coming from: Home  I have personally briefly reviewed patient's old medical records in [facility] EMR.  Chief Concern: Chest pressure  HPI: Curtis Cooke is a 61 year old male with history of hyperlipidemia, hypertension, class I obesity, history of CAD status post bypass graft in December 2000, who presents emergency department for chief concerns of left-sided chest pressure.  Initial vitals in the emergency department showed temperature 99.6, respiration rate of 15, heart rate 75, blood pressure initially 81/58 and improved to 113/65, SPO2 94% on room air.  Serum sodium 134, potassium 4.0, chloride 103, bicarb 20, BUN of 18, serum creatinine 1.18, nonfasting blood glucose 115, GFR greater than 60, WBC 12, hemoglobin 15.2, platelets of 295.  High sensitive troponin was elevated at 3242, initial value.  EDP consulted cardiology, Dr. Juliann Pares who plans to take patient to the Cath Lab on day of admission.  ED treatment: Heparin GTT, aspirin 324 mg p.o. one-time dose, sodium chloride 1 L bolus, fentanyl 50 mcg IV as needed for severe pain ordered by EDP.  At bedside patient was able to tell me his name, his age, he knows he is in the hospital.  He reports that he developed chest pressure on 07/23/2021.  He states that the chest pressure was initially a 9 out of 10 and persistent with associated left arm numbness and jaw discomfort.  He reports at bedside, but pressure is a 4 out of 10.  He reports it similar to previous episodes of his heart attacks.  He reports last bowel movement was 07/26/2021 and was normal.  He also reports that he last had a light meal at around 8 PM on 07/26/2021.  He has not had any appetite over the last 2 days and therefore did not eat on day of admission.  Social history: He lives at  home with his wife.  He denies history of tobacco use.  He infrequently drinks EtOH, last drink was 1 week ago and it was a glass of wine.  He denies recreational drug use.  He currently works as a Education officer, environmental.  ROS: Constitutional: no weight change, no fever ENT/Mouth: no sore throat, no rhinorrhea Eyes: no eye pain, no vision changes Cardiovascular: + chest pain, no dyspnea,  no edema, no palpitations Respiratory: no cough, no sputum, no wheezing Gastrointestinal: no nausea, no vomiting, no diarrhea, no constipation Genitourinary: no urinary incontinence, no dysuria, no hematuria Musculoskeletal: no arthralgias, no myalgias Skin: no skin lesions, no pruritus, Neuro: + weakness, no loss of consciousness, no syncope Psych: no anxiety, no depression, + decrease appetite Heme/Lymph: no bruising, no bleeding  ED Course: Discussed with emergency medicine provider, patient requiring hospitalization for chief concerns of NSTEMI.  Assessment/Plan  Principal Problem:   NSTEMI (non-ST elevated myocardial infarction) (HCC) Active Problems:   Left-sided chest pain   Essential hypertension   Hyperlipidemia   CAD (coronary artery disease) of bypass graft   Assessment and Plan:  * NSTEMI (non-ST elevated myocardial infarction) (HCC) - Continue heparin GTT - CT was negative for pulmonary embolism - Cardiology has been consulted and states will see the patient and planning for cath on day of admission - N.p.o. in anticipation of cardiac cath on day of admission - I have resumed aspirin 81 mg daily for 07/28/2021 - Pain management: Acetaminophen as needed for mild pain; morphine 2 mg IV every  3 hours as needed for moderate pain, 5 doses ordered; fentanyl 50 mcg IV every 2 hours as needed for severe pain, 3 doses ordered - AM team to reassess patient at bedside and determine further need of pain medication - Admit to progressive cardiac, inpatient  CAD (coronary artery disease) of bypass graft -  Status post CABG in December 2000  Hyperlipidemia - Atorvastatin 80 mg nightly resumed  Essential hypertension - Patient has taken his metoprolol tartrate 12.5 mg p.o. twice daily and ramipril 10 mg daily prior to presenting to the emergency department - I have resumed metoprolol tartrate 12.5 mg p.o. twice daily and ramipril 10 mg daily for 07/28/2021 - Hydralazine 10 mg p.o. every 6 hours as needed for SBP greater than 175, 4 days ordered  Chart reviewed.   Complete echo in 09/18/2019: Left ventricular ejection fraction estimated at 55 to 60%, the left ventricle has no regional wall motion abnormalities.  Left ventricular diastolic parameters were normal.  Patient had a left heart cardiac catheterization with PCI on 09/18/2019.  Patient received successful intervention of tandem lesions.  Mid body of SVG to diagonal and proximal RCA with drug-eluting stent.  DVT prophylaxis: Heparin GTT Code Status: Full code Diet: N.p.o. except for sips with meds; patient can be heart healthy diet post-cath Family Communication: I have called and updated wife over the phone at patient's request.  I discussed extensively the plan including a cardiac catheterization on day of admission and patient being admitted to progressive cardiac on heparin.  All questions were answered at this time.  And I have stated that another hospice provider will be taking over care and updating patient and family on 07/28/2021. Disposition Plan: Pending clinical course Consults called: Cardiology, [facility] Admission status: Progressive cardiac, inpatient  Past Medical History:  Diagnosis Date   Coronary artery disease    Hypertension    Past Surgical History:  Procedure Laterality Date   CARDIAC SURGERY     CORONARY STENT INTERVENTION N/A 09/18/2019   Procedure: CORONARY STENT INTERVENTION;  Surgeon: Alwyn Pea, MD;  Location: [facility] INVASIVE CV LAB;  Service: Cardiovascular;  Laterality: N/A;   LEFT HEART CATH  AND CORS/GRAFTS ANGIOGRAPHY N/A 09/18/2019   Procedure: LEFT HEART CATH AND CORS/GRAFTS ANGIOGRAPHY;  Surgeon: Dalia Heading, MD;  Location: [facility] INVASIVE CV LAB;  Service: Cardiovascular;  Laterality: N/A;   Social History:  reports that he has never smoked. He has never used smokeless tobacco. He reports current alcohol use. He reports that he does not use drugs.  No Known Allergies Family History  Problem Relation Age of Onset   Atrial fibrillation Mother    Hypertension Mother    CAD Father    Family history: Family history reviewed and not pertinent  Prior to Admission medications   Medication Sig Start Date End Date Taking? Authorizing Provider  aspirin EC 81 MG tablet Take 81 mg by mouth daily.    [provider]  atorvastatin (LIPITOR) 80 MG tablet Take 1 tablet (80 mg total) by mouth daily. 09/19/19 12/18/19  Darlin Priestly, MD  Cholecalciferol 50 MCG (2000 UT) CAPS Take 2,000 Units by mouth daily.    [provider]  co-enzyme Q-10 30 MG capsule Take 300 mg by mouth daily.    [provider]  magnesium gluconate (MAGONATE) 500 MG tablet Take 500 mg by mouth daily.     [provider]  metoprolol tartrate (LOPRESSOR) 25 MG tablet Take 0.5 tablets (12.5 mg total) by mouth  2 (two) times daily. 09/19/19 12/18/19  Darlin PriestlyLai, Tina, MD  ramipril (ALTACE) 10 MG capsule Take 1 capsule (10 mg total) by mouth daily. 09/19/19 12/18/19  Darlin PriestlyLai, Tina, MD   Physical Exam: Vitals:   07/27/21 0924 07/27/21 0930 07/27/21 1010 07/27/21 1030  BP:  (!) 81/50 95/72 113/65  Pulse:  73 81 70  Resp:  15 19 14   Temp:      TempSrc:      SpO2:  94% 96% 98%  Weight: 99 kg     Height: 5\' 7"  (1.702 m)      Constitutional: appears age-appropriate, NAD, calm, comfortable Eyes: PERRL, lids and conjunctivae normal ENMT: Mucous membranes are moist. Posterior pharynx clear of any exudate or lesions. Age-appropriate dentition.  Mild hearing loss. Neck: normal, supple, no masses, no  thyromegaly Respiratory: clear to auscultation bilaterally, no wheezing, no crackles. Normal respiratory effort. No accessory muscle use.  Cardiovascular: Regular rate and rhythm, no murmurs / rubs / gallops. No extremity edema. 2+ pedal pulses. No carotid bruits.  Abdomen: no tenderness, no masses palpated, no hepatosplenomegaly. Bowel sounds positive.  Musculoskeletal: no clubbing / cyanosis. No joint deformity upper and lower extremities. Good ROM, no contractures, no atrophy. Normal muscle tone.  Skin: no rashes, lesions, ulcers. No induration Neurologic: Sensation intact. Strength 5/5 in all 4.  Psychiatric: Normal judgment and insight. Alert and oriented x 3. Normal mood.   EKG: independently reviewed, showing sinus rhythm with rate of 69, QTc 532  Chest x-ray on Admission: I personally reviewed and I agree with radiologist reading as below.  CT Angio Chest Pulmonary Embolism (PE) W or WO Contrast  Result Date: 07/27/2021 CLINICAL DATA:  Left-sided pleuritic chest pain for several days. High clinical suspicion for pulmonary embolism. EXAM: CT ANGIOGRAPHY CHEST WITH CONTRAST TECHNIQUE: Multidetector CT imaging of the chest was performed using the standard protocol during bolus administration of intravenous contrast. Multiplanar CT image reconstructions and MIPs were obtained to evaluate the vascular anatomy. RADIATION DOSE REDUCTION: This exam was performed according to the departmental dose-optimization program which includes automated exposure control, adjustment of the mA and/or kV according to patient size and/or use of iterative reconstruction technique. CONTRAST:  75mL OMNIPAQUE IOHEXOL 350 MG/ML SOLN COMPARISON:  09/17/2019 FINDINGS: Cardiovascular: Satisfactory opacification of pulmonary arteries noted, and no pulmonary emboli identified. No evidence of thoracic aortic dissection or aneurysm. Aortic atherosclerotic calcification incidentally noted. Prior CABG again demonstrated.  Mediastinum/Nodes: No masses or pathologically enlarged lymph nodes identified. Lungs/Pleura: No pulmonary mass, infiltrate, or effusion. Stable 5 mm pulmonary nodule in lingula consistent with benign etiology Upper abdomen: No acute findings. Musculoskeletal: No suspicious bone lesions identified. Review of the MIP images confirms the above findings. IMPRESSION: No evidence of pulmonary embolism or other active disease. Aortic Atherosclerosis (ICD10-I70.0). Electronically Signed   By: Danae OrleansJohn A Stahl M.D.   On: 07/27/2021 12:13   DG Chest Portable 1 View  Result Date: 07/27/2021 CLINICAL DATA:  Left-sided chest pressure beginning 4 days ago. EXAM: PORTABLE CHEST 1 VIEW COMPARISON:  07/24/2021 FINDINGS: Previous median sternotomy and CABG. Heart size upper limits of normal. Mild aortic atherosclerosis. The lungs are clear. The vascularity is normal. No edema or effusions. IMPRESSION: Previous CABG.  No active disease. Electronically Signed   By: Paulina FusiMark  Shogry M.D.   On: 07/27/2021 10:06    Labs on Admission: I have personally reviewed following labs  CBC: Recent Labs  Lab 07/24/21 2324 07/27/21 0955  WBC 7.4 12.0*  NEUTROABS  --  8.3*  HGB  14.4 15.2  HCT 43.6 45.9  MCV 91.8 91.3  PLT 267 295   Basic Metabolic Panel: Recent Labs  Lab 07/24/21 2324 07/27/21 0955  NA 139 134*  K 4.0 4.0  CL 107 103  CO2 29 20*  GLUCOSE 159* 115*  BUN 17 18  CREATININE 1.05 1.14  CALCIUM 9.0 9.3   GFR: Estimated Creatinine Clearance: 75.4 mL/min (by C-G formula based on SCr of 1.14 mg/dL).  Liver Function Tests: Recent Labs  Lab 07/27/21 0955  AST 55*  ALT 32  ALKPHOS 26*  BILITOT 1.3*  PROT 7.5  ALBUMIN 4.3   Urine analysis:    Component Value Date/Time   COLORURINE YELLOW (A) 03/09/2017 0858   APPEARANCEUR HAZY (A) 03/09/2017 0858   LABSPEC 1.017 03/09/2017 0858   PHURINE 5.0 03/09/2017 0858   GLUCOSEU NEGATIVE 03/09/2017 0858   HGBUR NEGATIVE 03/09/2017 0858   BILIRUBINUR NEGATIVE  03/09/2017 0858   KETONESUR 80 (A) 03/09/2017 0858   PROTEINUR 30 (A) 03/09/2017 0858   NITRITE NEGATIVE 03/09/2017 0858   LEUKOCYTESUR NEGATIVE 03/09/2017 0858   Dr. Sedalia Muta [facility]  If 7PM-7AM, please contact overnight-coverage provider If 7AM-7PM, please contact day coverage provider [email or web address]  07/27/2021, 1:05 PM

## 2021-07-27 NOTE — Consult Note (Signed)
ANTICOAGULATION CONSULT NOTE - Initial Consult  Pharmacy Consult for heparin Indication: chest pain/ACS  No Known Allergies  Patient Measurements: Height: 5\' 7"  (170.2 cm) Weight: 99 kg (218 lb 4.1 oz) IBW/kg (Calculated) : 66.1 Heparin Dosing Weight: 87.5kg   Vital Signs: Temp: 99.6 F (37.6 C) (06/14 0923) Temp Source: Oral (06/14 0923) BP: 74/39 (06/14 0923) Pulse Rate: 73 (06/14 0923)  Labs: Recent Labs    07/24/21 2324 07/25/21 0122  HGB 14.4  --   HCT 43.6  --   PLT 267  --   CREATININE 1.05  --   TROPONINIHS 7 7    Estimated Creatinine Clearance: 81.8 mL/min (by C-G formula based on SCr of 1.05 mg/dL).   Medical History: Past Medical History:  Diagnosis Date   Coronary artery disease    Hypertension     Medications:  PTA: Aspirin 81mg  ec Inpatient: Heparin drip (6/14 >>) Allergies: NKDA  Assessment: 61 y.o. male with history of coronary artery disease status post CABG, hypertension who presents to the emergency department with left-sided chest pressure that has been intermittent over the past 2 days. Pharmacuy consulted for management of heparin drip in the setting of ACS/STEMI  Goal of Therapy:  Heparin level 0.3-0.7 units/ml Monitor platelets by anticoagulation protocol: Yes    Date Time aPTT/HL Rate/Comment        Plan: Give 4000 units bolus x1; then start heparin infusion at 1150 units/hr Check anti-Xa level in 6 hours and daily once consecutively therapeutic. Continue to monitor H&H and platelets daily while on heparin gtt.   Darrick Penna 07/27/2021,9:40 AM

## 2021-07-27 NOTE — Assessment & Plan Note (Addendum)
Hx of CABG in December 2000. Presented with chest pain, elevated troponins and T wave inversions on EKG. Management per cardiology.

## 2021-07-27 NOTE — Assessment & Plan Note (Addendum)
Presented with chest pain/pressure, elevated troponins and T wave inversions on EKG.  Taken to 6/14, no intervention unable to be performed.  CTA chest was negative for PE -- Cardiology following recommendations - Continue heparin drip --Aspirin Plavix, metoprolol, high intensity Lipitor, ramipril -- Pain control as needed

## 2021-07-27 NOTE — Assessment & Plan Note (Addendum)
BPs have been labile since admission -- Continue metoprolol, ramipril -- PO hydralazine as needed

## 2021-07-27 NOTE — Assessment & Plan Note (Addendum)
Continue Lipitor 80 mg ?

## 2021-07-27 NOTE — ED Provider Notes (Signed)
[facility] Provider Note    Event Date/Time   First MD Initiated Contact with Patient 07/27/21 [phone]     (approximate)   History   Chest Pain   HPI  Curtis Cooke is a 61 y.o. male   presents to the ER for evaluation of persistent chest discomfort.  Has a history of CABG as well as hypertension.  Seen on the 12th for chest pain and pressure.  Is having persistent tightness and pain in his chest.  Had follow-up with cardiology this morning.  Had pain radiating down his left arm was sent to the ER for admission and probable PCI today.  Denies any melena or hematochezia.  Did not take his blood pressure medications this morning.  Denies any headache.  Rates the pain is moderate in severity no diaphoresis.      Physical Exam   Triage Vital Signs: ED Triage Vitals  Enc Vitals Group     BP 07/27/21 0923 (!) 74/39     Pulse Rate 07/27/21 0923 73     Resp 07/27/21 0923 18     Temp 07/27/21 0923 99.6 F (37.6 C)     Temp Source 07/27/21 0923 Oral     SpO2 07/27/21 0923 95 %     Weight 07/27/21 0924 218 lb 4.1 oz (99 kg)     Height 07/27/21 0924 5\' 7"  (1.702 m)     Head Circumference --      Peak Flow --      Pain Score 07/27/21 0924 8     Pain Loc --      Pain Edu? --      Excl. in GC? --     Most recent vital signs: Vitals:   07/27/21 1010 07/27/21 1030  BP: 95/72 113/65  Pulse: 81 70  Resp: 19 14  Temp:    SpO2: 96% 98%     Constitutional: Alert  Eyes: Conjunctivae are normal.  Head: Atraumatic. Nose: No congestion/rhinnorhea. Mouth/Throat: Mucous membranes are moist.   Neck: Painless ROM.  Cardiovascular:   Good peripheral circulation. Respiratory: Normal respiratory effort.  No retractions.  Gastrointestinal: Soft and nontender.  Musculoskeletal:  no deformity Neurologic:  MAE spontaneously. No gross focal neurologic deficits are appreciated.  Skin:  Skin is warm, dry and intact. No rash noted. Psychiatric: Mood and affect are normal.  Speech and behavior are normal.    ED Results / Procedures / Treatments   Labs (all labs ordered are listed, but only abnormal results are displayed) Labs Reviewed  CBC WITH DIFFERENTIAL/PLATELET - Abnormal; Notable for the following components:      Result Value   WBC 12.0 (*)    Neutro Abs 8.3 (*)    All other components within normal limits  COMPREHENSIVE METABOLIC PANEL - Abnormal; Notable for the following components:   Sodium 134 (*)    CO2 20 (*)    Glucose, Bld 115 (*)    AST 55 (*)    Alkaline Phosphatase 26 (*)    Total Bilirubin 1.3 (*)    All other components within normal limits  TROPONIN I (HIGH SENSITIVITY) - Abnormal; Notable for the following components:   Troponin I (High Sensitivity) 3,242 (*)    All other components within normal limits  APTT  HEPARIN LEVEL (UNFRACTIONATED)  HIV ANTIBODY (ROUTINE TESTING W REFLEX)  BRAIN NATRIURETIC PEPTIDE  TROPONIN I (HIGH SENSITIVITY)     EKG  ED ECG REPORT I, Willy EddyPatrick Climmie Cronce, the attending physician, personally  viewed and interpreted this ECG.   Date: 07/27/2021  EKG Time: 9:23  Rate: 70  Rhythm: sinus  Axis: right  Intervals:prolonged qt  ST&T Change: anterolateral twave inversion new since 6/12    RADIOLOGY Please see ED Course for my review and interpretation.  I personally reviewed all radiographic images ordered to evaluate for the above acute complaints and reviewed radiology reports and findings.  These findings were personally discussed with the patient.  Please see medical record for radiology report.    PROCEDURES:  Critical Care performed: Yes, see critical care procedure note(s)    .Critical Care  Performed by: Willy Eddy, MD Authorized by: Willy Eddy, MD   Critical care provider statement:    Critical care time (minutes):  45   Critical care was necessary to treat or prevent imminent or life-threatening deterioration of the following conditions:  Circulatory failure    Critical care was time spent personally by me on the following activities:  Ordering and performing treatments and interventions, ordering and review of laboratory studies, ordering and review of radiographic studies, pulse oximetry, re-evaluation of patient's condition, review of old charts, obtaining history from patient or surrogate, examination of patient, evaluation of patient's response to treatment, discussions with primary provider, discussions with consultants and development of treatment plan with patient or surrogate    MEDICATIONS ORDERED IN ED: Medications  fentaNYL (SUBLIMAZE) injection 50 mcg (50 mcg Intravenous Given 07/27/21 1008)  heparin ADULT infusion 100 units/mL (25000 units/218mL) (1,150 Units/hr Intravenous New Bag/Given 07/27/21 1005)  acetaminophen (TYLENOL) tablet 650 mg (has no administration in time range)    Or  acetaminophen (TYLENOL) suppository 650 mg (has no administration in time range)  ondansetron (ZOFRAN) tablet 4 mg (has no administration in time range)    Or  ondansetron (ZOFRAN) injection 4 mg (has no administration in time range)  polyethylene glycol (MIRALAX / GLYCOLAX) packet 17 g (has no administration in time range)  sodium chloride 0.9 % bolus 1,000 mL (0 mLs Intravenous Stopped 07/27/21 1109)  aspirin chewable tablet 324 mg (324 mg Oral Given 07/27/21 1006)  heparin bolus via infusion 4,000 Units (4,000 Units Intravenous Bolus from Bag 07/27/21 1006)  iohexol (OMNIPAQUE) 350 MG/ML injection 75 mL (75 mLs Intravenous Contrast Given 07/27/21 1146)     IMPRESSION / MDM / ASSESSMENT AND PLAN / ED COURSE  I reviewed the triage vital signs and the nursing notes.                              Differential diagnosis includes, but is not limited to, ACS, pericarditis, esophagitis, boerhaaves, pe, dissection, pna, bronchitis, costochondritis  Patient presented to the ER for evaluation of chest pain and pressure from cardiology clinic as described above.   EKG with new T wave inversions as compared to previous.  Found to be hypotensive will give IV fluids.  This presenting complaint could reflect a potentially life-threatening illness therefore the patient will be placed on continuous pulse oximetry and telemetry for monitoring.  Laboratory evaluation will be sent to evaluate for the above complaints.      Clinical Course as of 07/27/21 1224  Wed Jul 27, 2021  3716 Have consulted Dr. Juliann Pares of cardiology.  Patient to be placed on heparin will be given aspirin given IV fluids given low blood pressure. [PR]  [phone] Discussed case in consultation with Dr. Juliann Pares of cardiology he has requested CTA to rule out PE given the patient's  persistent pain and hypotension.  If negative we will plan on taken to cardiac cath.  I have heparinized the patient regardless.  Blood pressure is improving after IV fluids. [PR]  1037 BP improving. [PR]  1153 Opponent is critically elevated greater than 3000.  Blood pressure improving with IV hydration and he was on heparin.  Awaiting CTA [PR]  1159 CTA my interpretation does not show any evidence of PE.  Have consulted hospitalist for admission. [PR]    Clinical Course User Index [PR] Willy Eddy, MD    Patient's presentation is most consistent with acute presentation with potential threat to life or bodily function.   FINAL CLINICAL IMPRESSION(S) / ED DIAGNOSES   Final diagnoses:  ACS (acute coronary syndrome) (HCC)  Chest pain, unspecified type     Rx / DC Orders   ED Discharge Orders     None        Note:  This document was prepared using Dragon voice recognition software and may include unintentional dictation errors.    Willy Eddy, MD 07/27/21 1224

## 2021-07-27 NOTE — ED Triage Notes (Signed)
Pt reports left sided chest pressure that began Saturday. Pt was seen by Dr Juliann Pares this am and given Nitro in office and was told that he was going to do a heart cath on him today.

## 2021-07-27 NOTE — Hospital Course (Addendum)
Mr. Curtis Cooke is a 61 year old male with history of hyperlipidemia, hypertension, class I obesity, history of CAD status post bypass graft in December 2000, who presents emergency department for chief concerns of left-sided chest pressure.  Vitals stable in the ED.  Labs mostly unremarkable except for hs-troponin was elevated at 3242.   EKG with T wave inversions anterolaterally. Started on heparin drip.  EDP consulted Dr. Juliann Pares with [facility] cardiology.  Patient was taken to the Cath Lab on day of admission.  No intervention was able to be performed.  See cath report for full details of findings.  Medical management recommended.

## 2021-07-27 NOTE — Consult Note (Signed)
CARDIOLOGY CONSULT NOTE               Patient ID: Curtis Cooke MRN: [id number] DOB/AGE: Jan 18, 1961 61 y.o.  Admit date: 07/27/2021 Referring Physician Dr. Rupert Stacks hospitalist Primary Physician Dr. Netty Starring primary Primary Cardiologist Dr. Ubaldo Glassing Reason for Consultation non-STEMI unstable angina  HPI: Patient is a 61 year old male history of hyperlipidemia hypertension borderline obesity multivessel coronary disease status post coronary bypass surgery PCI and stent in the past who has been having unstable anginal symptoms over the past week was seen in emergency room on the 11th evaluated had negative troponins and was discharged home advised to follow-up with cardiology came to cardiologist office on the 14th with persistent chest pain and discomfort EKG was specifically different from previously because of persistent pain and abnormal EKG known coronary disease he was referred to the emergency room for admission and evaluation.  His initial troponin was over 3000 EKG continue to evolve with deep T wave inversions anterolaterally pain was improved with aggressive therapy.  Patient was advised to be admitted and potentially undergo cardiac cath for evaluation of coronary anatomy  Review of systems complete and found to be negative unless listed above     Past Medical History:  Diagnosis Date   Coronary artery disease    Hypertension     Past Surgical History:  Procedure Laterality Date   CARDIAC SURGERY     CORONARY STENT INTERVENTION N/A 09/18/2019   Procedure: CORONARY STENT INTERVENTION;  Surgeon: Yolonda Kida, MD;  Location: [facility];  Service: Cardiovascular;  Laterality: N/A;   LEFT HEART CATH AND CORS/GRAFTS ANGIOGRAPHY N/A 09/18/2019   Procedure: LEFT HEART CATH AND CORS/GRAFTS ANGIOGRAPHY;  Surgeon: Teodoro Spray, MD;  Location: [facility];  Service: Cardiovascular;  Laterality: N/A;    (Not in a hospital admission)  Social History    Socioeconomic History   Marital status: Married    Spouse name: Not on file   Number of children: Not on file   Years of education: Not on file   Highest education level: Not on file  Occupational History   Not on file  Tobacco Use   Smoking status: Never   Smokeless tobacco: Never  Vaping Use   Vaping Use: Never used  Substance and Sexual Activity   Alcohol use: Yes   Drug use: No   Sexual activity: Not on file  Other Topics Concern   Not on file  Social History Narrative   Not on file   Social Determinants of Health   Financial Resource Strain: Not on file  Food Insecurity: Not on file  Transportation Needs: Not on file  Physical Activity: Not on file  Stress: Not on file  Social Connections: Not on file  Intimate Partner Violence: Not on file    Family History  Problem Relation Age of Onset   Atrial fibrillation Mother    Hypertension Mother    CAD Father       Review of systems complete and found to be negative unless listed above      PHYSICAL EXAM  General: Well developed, well nourished, in no acute distress HEENT:  Normocephalic and atramatic Neck:  No JVD.  Lungs: Clear bilaterally to auscultation and percussion. Heart: HRRR . Normal S1 and S2 without gallops or murmurs.  Abdomen: Bowel sounds are positive, abdomen soft and non-tender  Msk:  Back normal, normal gait. Normal strength and tone for age. Extremities: No clubbing, cyanosis or edema.  Neuro: Alert and oriented X 3. Psych:  Good affect, responds appropriately  Labs:   Lab Results  Component Value Date   WBC 12.0 (H) 07/27/2021   HGB 15.2 07/27/2021   HCT 45.9 07/27/2021   MCV 91.3 07/27/2021   PLT 295 07/27/2021    Recent Labs  Lab 07/27/21 0955  NA 134*  K 4.0  CL 103  CO2 20*  BUN 18  CREATININE 1.14  CALCIUM 9.3  PROT 7.5  BILITOT 1.3*  ALKPHOS 26*  ALT 32  AST 55*  GLUCOSE 115*   No results found for: "CKTOTAL", "CKMB", "CKMBINDEX", "TROPONINI"  Lab  Results  Component Value Date   CHOL 208 (H) 09/17/2019   Lab Results  Component Value Date   HDL 30 (L) 09/17/2019   Lab Results  Component Value Date   LDLCALC 145 (H) 09/17/2019   Lab Results  Component Value Date   TRIG 167 (H) 09/17/2019   Lab Results  Component Value Date   CHOLHDL 6.9 09/17/2019   No results found for: "LDLDIRECT"    Radiology: CT Angio Chest Pulmonary Embolism (PE) W or WO Contrast  Result Date: 07/27/2021 CLINICAL DATA:  Left-sided pleuritic chest pain for several days. High clinical suspicion for pulmonary embolism. EXAM: CT ANGIOGRAPHY CHEST WITH CONTRAST TECHNIQUE: Multidetector CT imaging of the chest was performed using the standard protocol during bolus administration of intravenous contrast. Multiplanar CT image reconstructions and MIPs were obtained to evaluate the vascular anatomy. RADIATION DOSE REDUCTION: This exam was performed according to the departmental dose-optimization program which includes automated exposure control, adjustment of the mA and/or kV according to patient size and/or use of iterative reconstruction technique. CONTRAST:  87mL OMNIPAQUE IOHEXOL 350 MG/ML SOLN COMPARISON:  09/17/2019 FINDINGS: Cardiovascular: Satisfactory opacification of pulmonary arteries noted, and no pulmonary emboli identified. No evidence of thoracic aortic dissection or aneurysm. Aortic atherosclerotic calcification incidentally noted. Prior CABG again demonstrated. Mediastinum/Nodes: No masses or pathologically enlarged lymph nodes identified. Lungs/Pleura: No pulmonary mass, infiltrate, or effusion. Stable 5 mm pulmonary nodule in lingula consistent with benign etiology Upper abdomen: No acute findings. Musculoskeletal: No suspicious bone lesions identified. Review of the MIP images confirms the above findings. IMPRESSION: No evidence of pulmonary embolism or other active disease. Aortic Atherosclerosis (ICD10-I70.0). Electronically Signed   By: Marlaine Hind  M.D.   On: 07/27/2021 12:13   DG Chest Portable 1 View  Result Date: 07/27/2021 CLINICAL DATA:  Left-sided chest pressure beginning 4 days ago. EXAM: PORTABLE CHEST 1 VIEW COMPARISON:  07/24/2021 FINDINGS: Previous median sternotomy and CABG. Heart size upper limits of normal. Mild aortic atherosclerosis. The lungs are clear. The vascularity is normal. No edema or effusions. IMPRESSION: Previous CABG.  No active disease. Electronically Signed   By: Nelson Chimes M.D.   On: 07/27/2021 10:06   DG Chest 1 View  Result Date: 07/24/2021 CLINICAL DATA:  Left-sided chest pressure x2 days. EXAM: CHEST  1 VIEW COMPARISON:  September 17, 2019 FINDINGS: Multiple sternal wires and vascular clips are seen. The heart size and mediastinal contours are within normal limits. There is no evidence of acute infiltrate, pleural effusion or pneumothorax. The visualized skeletal structures are unremarkable. IMPRESSION: 1. Evidence of prior median sternotomy/CABG. 2. No acute or active cardiopulmonary disease. Electronically Signed   By: Virgina Norfolk M.D.   On: 07/24/2021 23:45    EKG: Normal sinus rhythm subtle diffuse ST segment changes suggestive of possible ischemia rate of 70 deep T wave inversions anterior laterally  ASSESSMENT  AND PLAN:  Non-STEMI Elevated troponins Unstable angina Multivessel coronary artery disease Chest pain Coronary bypass surgery 2000 Hyperlipidemia Hypertension History of PCI and stent . Plan Agree with heparin management intravenously for anticoagulation Admit rule out myocardial infarction follow-up troponins Echocardiogram for assessment of central function wall motion Aspirin therapy would probably add Plavix as well Recommend statin therapy with Lipitor 80 mg daily  Continue beta-blockade therapy with metoprolol Ramipril 10 mg daily can be continued Recommend cardiac cath within the next 24 to 48 hours I suspect the patient has a compromised bypass graft but he appears to  be a late presenter with his initial troponins of over 3000  Signed: Yolonda Kida MD 07/27/2021, 1:47 PM

## 2021-07-28 ENCOUNTER — Encounter: Payer: Self-pay | Admitting: Internal Medicine

## 2021-07-28 DIAGNOSIS — I214 Non-ST elevation (NSTEMI) myocardial infarction: Secondary | ICD-10-CM | POA: Diagnosis not present

## 2021-07-28 LAB — CBC
HCT: 42 % (ref 39.0–52.0)
Hemoglobin: 14.3 g/dL (ref 13.0–17.0)
MCH: 30.8 pg (ref 26.0–34.0)
MCHC: 34 g/dL (ref 30.0–36.0)
MCV: 90.5 fL (ref 80.0–100.0)
Platelets: 226 10*3/uL (ref 150–400)
RBC: 4.64 MIL/uL (ref 4.22–5.81)
RDW: 12.5 % (ref 11.5–15.5)
WBC: 9 10*3/uL (ref 4.0–10.5)
nRBC: 0 % (ref 0.0–0.2)

## 2021-07-28 LAB — BASIC METABOLIC PANEL
Anion gap: 6 (ref 5–15)
BUN: 12 mg/dL (ref 8–23)
CO2: 25 mmol/L (ref 22–32)
Calcium: 8.7 mg/dL — ABNORMAL LOW (ref 8.9–10.3)
Chloride: 107 mmol/L (ref 98–111)
Creatinine, Ser: 0.78 mg/dL (ref 0.61–1.24)
GFR, Estimated: >60 mL/min (ref >60)
Glucose, Bld: 131 mg/dL — ABNORMAL HIGH (ref 70–99)
Potassium: 3.7 mmol/L (ref 3.5–5.1)
Sodium: 138 mmol/L (ref 135–145)

## 2021-07-28 LAB — HIV ANTIBODY (ROUTINE TESTING W REFLEX): HIV Screen 4th Generation wRfx: NONREACTIVE

## 2021-07-28 MED ORDER — ROSUVASTATIN CALCIUM 10 MG PO TABS
40.0000 mg | ORAL_TABLET | Freq: Every day | ORAL | Status: DC
  Administered 2021-07-28: 40 mg via ORAL
  Filled 2021-07-28: qty 4

## 2021-07-28 MED ORDER — OXYCODONE-ACETAMINOPHEN 5-325 MG PO TABS
2.0000 | ORAL_TABLET | ORAL | Status: DC | PRN
  Administered 2021-07-28 – 2021-07-29 (×5): 2 via ORAL
  Filled 2021-07-28 (×5): qty 2

## 2021-07-28 MED ORDER — METOPROLOL TARTRATE 50 MG PO TABS
50.0000 mg | ORAL_TABLET | Freq: Two times a day (BID) | ORAL | Status: DC
  Administered 2021-07-28 (×2): 50 mg via ORAL
  Filled 2021-07-28 (×3): qty 1

## 2021-07-28 MED ORDER — INDOMETHACIN 25 MG PO CAPS
25.0000 mg | ORAL_CAPSULE | Freq: Two times a day (BID) | ORAL | Status: DC
  Administered 2021-07-28 – 2021-07-29 (×3): 25 mg via ORAL
  Filled 2021-07-28 (×4): qty 1

## 2021-07-28 MED ORDER — FENTANYL CITRATE PF 50 MCG/ML IJ SOSY
50.0000 ug | PREFILLED_SYRINGE | INTRAMUSCULAR | Status: DC | PRN
  Administered 2021-07-28: 50 ug via INTRAVENOUS
  Filled 2021-07-28: qty 1

## 2021-07-28 NOTE — Progress Notes (Signed)
Progress Note   Patient: Curtis Cooke IDP:[id number] DOB: 03-21-1960 DOA: 07/27/2021     1 DOS: the patient was seen and examined on 07/28/2021   Brief hospital course: Curtis Cooke is a 61 year old male with history of hyperlipidemia, hypertension, class I obesity, history of CAD status post bypass graft in December 2000, who presents emergency department for chief concerns of left-sided chest pressure.  Vitals stable in the ED.  Labs mostly unremarkable except for hs-troponin was elevated at 3242.   EKG with T wave inversions anterolaterally. Started on heparin drip.  EDP consulted Dr. Juliann Pares with [facility] cardiology.  Patient was taken to the Cath Lab on day of admission.  No intervention was able to be performed.  See cath report for full details of findings.  Medical management recommended.   Assessment and Plan: * NSTEMI (non-ST elevated myocardial infarction) (HCC) Presented with chest pain/pressure, elevated troponins and T wave inversions on EKG.  Taken to 6/14, no intervention unable to be performed.  CTA chest was negative for PE -- Cardiology following recommendations - Continue heparin drip --Aspirin Plavix, metoprolol, high intensity Lipitor, ramipril -- Pain control as needed  CAD (coronary artery disease) of bypass graft Hx of CABG in December 2000. Presented with chest pain, elevated troponins and T wave inversions on EKG. Management per cardiology.  Hyperlipidemia Continue Lipitor 80 mg  Essential hypertension BPs have been labile since admission -- Continue metoprolol, ramipril -- PO hydralazine as needed  Left-sided chest pain Management as outlined, per cardiology        Subjective: Patient awake resting bed when seen on rounds today.  He states that he had significant chest pain overnight.  He was given Percocet about 1 hour ago and is already feeling better although a bit woozy he is agreeable to seeing how his symptoms do with ambulation today but feels  like he should not give her pain medication time to wear off first.  Physical Exam: Vitals:   07/27/21 2336 07/28/21 0417 07/28/21 0735 07/28/21 1133  BP: 114/72 (!) 167/97 (!) 164/87 130/76  Pulse: 85 88 78 63  Resp: 16 17 19 17   Temp: 98.6 F (37 C) 98.4 F (36.9 C) 98.1 F (36.7 C) 97.8 F (36.6 C)  TempSrc: Oral  Oral Oral  SpO2: 97% 97% 98% 97%  Weight:      Height:       General exam: awake, alert, no acute distress HEENT: atraumatic, clear conjunctiva, anicteric sclera, moist mucus membranes, hearing grossly normal  Respiratory system: CTAB, no wheezes, rales or rhonchi, normal respiratory effort. Cardiovascular system: normal S1/S2, RRR, no JVD, murmurs, rubs, gallops, no pedal edema.   Gastrointestinal system: soft, NT, ND, no HSM felt, +bowel sounds. Central nervous system: A&O x4. no gross focal neurologic deficits, normal speech Extremities: moves all, no edema, normal tone Skin: dry, intact, normal temperature Psychiatry: normal mood, congruent affect, judgement and insight appear normal   Data Reviewed:  Notable labs: Glucose 131, calcium 8.7.  Left heart cath 6/14:   -- Right groin access site --EF 45 to 50% on left ventriculogram --No intervention performed --Multiple occluded vessels and prior grafts, see full report for details  Family Communication: None  Disposition: Status is: Inpatient Remains inpatient appropriate because: Remains on heparin drip.  Anticipate discharge in 24 to 48 hours pending clearance by cardiology    Planned Discharge Destination: Home    Time spent: 40 minutes  Author: 7/14, DO 07/28/2021 2:20 PM  For on  call review [email or web address].

## 2021-07-28 NOTE — Assessment & Plan Note (Signed)
Management as outlined, per cardiology

## 2021-07-28 NOTE — TOC Initial Note (Signed)
Transition of Care [facility]) - Initial/Assessment Note    Patient Details  Name: Curtis Cooke MRN: [id number] Date of Birth: January 01, 1961  Transition of Care [facility]) CM/SW Contact:    Truddie Hidden, RN Phone Number: 07/28/2021, 9:42 AM  Clinical Narrative:                  Transition of Care [facility]) Screening Note   Patient Details  Name: Curtis Cooke Date of Birth: Jul 12, 1960   Transition of Care [facility]) CM/SW Contact:    Truddie Hidden, RN Phone Number: 07/28/2021, 9:42 AM    Transition of Care Department [facility]) has reviewed patient and no TOC needs have been identified at this time. We will continue to monitor patient advancement through interdisciplinary progression rounds. If new patient transition needs arise, please place a TOC consult.          Patient Goals and CMS Choice        Expected Discharge Plan and Services                                                Prior Living Arrangements/Services                       Activities of Daily Living Home Assistive Devices/Equipment: None ADL Screening (condition at time of admission) Patient's cognitive ability adequate to safely complete daily activities?: Yes Is the patient deaf or have difficulty hearing?: No Does the patient have difficulty seeing, even when wearing glasses/contacts?: No Does the patient have difficulty concentrating, remembering, or making decisions?: No Patient able to express need for assistance with ADLs?: Yes Does the patient have difficulty dressing or bathing?: No Independently performs ADLs?: Yes (appropriate for developmental age) Does the patient have difficulty walking or climbing stairs?: No Weakness of Legs: None Weakness of Arms/Hands: None  Permission Sought/Granted                  Emotional Assessment              Admission diagnosis:  ACS (acute coronary syndrome) (HCC) [I24.9] NSTEMI (non-ST elevated myocardial infarction) (HCC) [I21.4] Chest  pain, unspecified type [R07.9] Patient Active Problem List   Diagnosis Date Noted   NSTEMI (non-ST elevated myocardial infarction) (HCC) 07/27/2021   Essential hypertension 07/27/2021   Hyperlipidemia 07/27/2021   CAD (coronary artery disease) of bypass graft 07/27/2021   Left-sided chest pain 09/17/2019   PCP:  Marisue Ivan, MD Pharmacy:   [facility] [id number] [place], [place] - [address] [place] [address] Phone: [phone] Fax: [phone]     Social Determinants of Health (SDOH) Interventions    Readmission Risk Interventions     No data to display

## 2021-07-28 NOTE — Progress Notes (Signed)
[facility] Cardiology    SUBJECTIVE: Patient still having some mid chest discomfort had a particularly difficult evening with chest pressure.  Denies any significant shortness of breath getting relief from fentanyl denies any blackout spells or syncope no leg edema   Vitals:   07/27/21 1930 07/27/21 2336 07/28/21 0417 07/28/21 0735  BP: 131/82 114/72 (!) 167/97 (!) 164/87  Pulse: 84 85 88 78  Resp: 16 16 17 19   Temp:  98.6 F (37 C) 98.4 F (36.9 C) 98.1 F (36.7 C)  TempSrc:  Oral  Oral  SpO2: 97% 97% 97% 98%  Weight:      Height:         Intake/Output Summary (Last 24 hours) at 07/28/2021 0800 Last data filed at 07/28/2021 0720 Gross per 24 hour  Intake 3 ml  Output 0 ml  Net 3 ml      PHYSICAL EXAM  General: Well developed, well nourished, in no acute distress HEENT:  Normocephalic and atramatic Neck:  No JVD.  Lungs: Clear bilaterally to auscultation and percussion. Heart: HRRR . Normal S1 and S2 without gallops or murmurs.  Abdomen: Bowel sounds are positive, abdomen soft and non-tender  Msk:  Back normal, normal gait. Normal strength and tone for age. Extremities: No clubbing, cyanosis or edema.   Neuro: Alert and oriented X 3. Psych:  Good affect, responds appropriately   LABS: Basic Metabolic Panel: Recent Labs    07/27/21 0955 07/28/21 0419  NA 134* 138  K 4.0 3.7  CL 103 107  CO2 20* 25  GLUCOSE 115* 131*  BUN 18 12  CREATININE 1.14 0.78  CALCIUM 9.3 8.7*   Liver Function Tests: Recent Labs    07/27/21 0955  AST 55*  ALT 32  ALKPHOS 26*  BILITOT 1.3*  PROT 7.5  ALBUMIN 4.3   No results for input(s): "LIPASE", "AMYLASE" in the last 72 hours. CBC: Recent Labs    07/27/21 0955 07/28/21 0419  WBC 12.0* 9.0  NEUTROABS 8.3*  --   HGB 15.2 14.3  HCT 45.9 42.0  MCV 91.3 90.5  PLT 295 226   Cardiac Enzymes: No results for input(s): "CKTOTAL", "CKMB", "CKMBINDEX", "TROPONINI" in the last 72 hours. BNP: Invalid input(s):  "POCBNP" D-Dimer: No results for input(s): "DDIMER" in the last 72 hours. Hemoglobin A1C: No results for input(s): "HGBA1C" in the last 72 hours. Fasting Lipid Panel: No results for input(s): "CHOL", "HDL", "LDLCALC", "TRIG", "CHOLHDL", "LDLDIRECT" in the last 72 hours. Thyroid Function Tests: No results for input(s): "TSH", "T4TOTAL", "T3FREE", "THYROIDAB" in the last 72 hours.  Invalid input(s): "FREET3" Anemia Panel: No results for input(s): "VITAMINB12", "FOLATE", "FERRITIN", "TIBC", "IRON", "RETICCTPCT" in the last 72 hours.  CARDIAC CATHETERIZATION  Result Date: 07/27/2021   Mid LAD lesion is 100% stenosed.   Ost Cx to Prox Cx lesion is 90% stenosed.   Mid Cx lesion is 100% stenosed.   Origin to Prox Graft lesion is 100% stenosed.   Origin lesion is 100% stenosed.   Prox Cx to Mid Cx lesion is 100% stenosed.   Non-stenotic Mid RCA lesion was previously treated.   Non-stenotic Prox RCA lesion was previously treated.   Non-stenotic Prox Graft lesion was previously treated.   There is mild left ventricular systolic dysfunction.   LV end diastolic pressure is mildly elevated.   The left ventricular ejection fraction is 45-50% by visual estimate. Conclusion Successful left heart cath from right groin Left ventriculogram showed mildly depressed ventricular function with anterior hypokinesis EF around  45 to 50% Coronaries Native Left main large free of disease LAD large 100% proximal occluded Circumflex large 99% ostial 100% mid occluded RCA large widely patent widely patent stents in the proximal to mid segment Moderate disease in the PL 50-75% TIMI-3 flow Extensive collaterals right to left Grafts LIMA to mid LAD widely patent distal LAD diffuse 50 to 75% lesions distally the mid 3 flow SVG to circumflex 100% occluded CTO from previously TIMI 0 flow SVG to diagonal 100% occluded with heavy thrombus at the origin and proximal graft recent occlusion, IRA totally occluded mid body stent TIMI 0 flow  Intervention deferred Patient tolerated procedure well Minx deployed Recommend medical therapy   CT Angio Chest Pulmonary Embolism (PE) W or WO Contrast  Result Date: 07/27/2021 CLINICAL DATA:  Left-sided pleuritic chest pain for several days. High clinical suspicion for pulmonary embolism. EXAM: CT ANGIOGRAPHY CHEST WITH CONTRAST TECHNIQUE: Multidetector CT imaging of the chest was performed using the standard protocol during bolus administration of intravenous contrast. Multiplanar CT image reconstructions and MIPs were obtained to evaluate the vascular anatomy. RADIATION DOSE REDUCTION: This exam was performed according to the departmental dose-optimization program which includes automated exposure control, adjustment of the mA and/or kV according to patient size and/or use of iterative reconstruction technique. CONTRAST:  49mL OMNIPAQUE IOHEXOL 350 MG/ML SOLN COMPARISON:  09/17/2019 FINDINGS: Cardiovascular: Satisfactory opacification of pulmonary arteries noted, and no pulmonary emboli identified. No evidence of thoracic aortic dissection or aneurysm. Aortic atherosclerotic calcification incidentally noted. Prior CABG again demonstrated. Mediastinum/Nodes: No masses or pathologically enlarged lymph nodes identified. Lungs/Pleura: No pulmonary mass, infiltrate, or effusion. Stable 5 mm pulmonary nodule in lingula consistent with benign etiology Upper abdomen: No acute findings. Musculoskeletal: No suspicious bone lesions identified. Review of the MIP images confirms the above findings. IMPRESSION: No evidence of pulmonary embolism or other active disease. Aortic Atherosclerosis (ICD10-I70.0). Electronically Signed   By: Danae Orleans M.D.   On: 07/27/2021 12:13   DG Chest Portable 1 View  Result Date: 07/27/2021 CLINICAL DATA:  Left-sided chest pressure beginning 4 days ago. EXAM: PORTABLE CHEST 1 VIEW COMPARISON:  07/24/2021 FINDINGS: Previous median sternotomy and CABG. Heart size upper limits of  normal. Mild aortic atherosclerosis. The lungs are clear. The vascularity is normal. No edema or effusions. IMPRESSION: Previous CABG.  No active disease. Electronically Signed   By: Paulina Fusi M.D.   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

## 2021-07-29 DIAGNOSIS — I214 Non-ST elevation (NSTEMI) myocardial infarction: Secondary | ICD-10-CM | POA: Diagnosis not present

## 2021-07-29 LAB — BASIC METABOLIC PANEL
Anion gap: 7 (ref 5–15)
BUN: 10 mg/dL (ref 8–23)
CO2: 26 mmol/L (ref 22–32)
Calcium: 9 mg/dL (ref 8.9–10.3)
Chloride: 103 mmol/L (ref 98–111)
Creatinine, Ser: 0.68 mg/dL (ref 0.61–1.24)
GFR, Estimated: >60 mL/min (ref >60)
Glucose, Bld: 105 mg/dL — ABNORMAL HIGH (ref 70–99)
Potassium: 4.3 mmol/L (ref 3.5–5.1)
Sodium: 136 mmol/L (ref 135–145)

## 2021-07-29 LAB — MAGNESIUM: Magnesium: 1.9 mg/dL (ref 1.7–2.4)

## 2021-07-29 LAB — LIPOPROTEIN A (LPA): Lipoprotein (a): 121.7 nmol/L — ABNORMAL HIGH (ref <75.0)

## 2021-07-29 MED ORDER — CLOPIDOGREL BISULFATE 75 MG PO TABS: 75.0000 mg | ORAL_TABLET | Freq: Every day | ORAL | 1 refills | Status: DC

## 2021-07-29 MED ORDER — ISOSORBIDE MONONITRATE ER 60 MG PO TB24: 60.0000 mg | ORAL_TABLET | Freq: Every day | ORAL | 1 refills | Status: AC

## 2021-07-29 MED ORDER — METOPROLOL TARTRATE 50 MG PO TABS: 50.0000 mg | ORAL_TABLET | Freq: Two times a day (BID) | ORAL | 1 refills | Status: AC

## 2021-07-29 MED ORDER — OXYCODONE-ACETAMINOPHEN 5-325 MG PO TABS: 1.0000 | ORAL_TABLET | Freq: Four times a day (QID) | ORAL | 0 refills | Status: DC | PRN

## 2021-07-29 MED ORDER — ROSUVASTATIN CALCIUM 40 MG PO TABS: 40.0000 mg | ORAL_TABLET | Freq: Every day | ORAL | 1 refills | Status: AC

## 2021-07-29 MED ORDER — INDOMETHACIN 25 MG PO CAPS: 25.0000 mg | ORAL_CAPSULE | Freq: Two times a day (BID) | ORAL | 0 refills | Status: AC

## 2021-07-29 NOTE — Discharge Summary (Signed)
Physician Discharge Summary   Patient: Curtis Cooke MRN: [id number] DOB: 24-Jun-1960  Admit date:     07/27/2021  Discharge date: 08/01/21  Discharge Physician: Pennie Banter   PCP: Marisue Ivan, MD   Recommendations at discharge:   Follow up with Cardiology Follow up with Primary Care in 1-2 weeks Repeat BMP, Mg, CBC in 1-2 weeks   Discharge Diagnoses: Principal Problem:   NSTEMI (non-ST elevated myocardial infarction) [facility]) Active Problems:   Essential hypertension   Hyperlipidemia   CAD (coronary artery disease) of bypass graft  Resolved Problems:   Left-sided chest pain  Hospital Course: Mr. Kunio Cummiskey is a 61 year old male with history of hyperlipidemia, hypertension, class I obesity, history of CAD status post bypass graft in December 2000, who presents emergency department for chief concerns of left-sided chest pressure.  Vitals stable in the ED.  Labs mostly unremarkable except for hs-troponin was elevated at 3242.   EKG with T wave inversions anterolaterally. Started on heparin drip.  EDP consulted Dr. Juliann Pares with [facility] cardiology.  Patient was taken to the Cath Lab on day of admission.  No intervention was able to be performed.  See cath report for full details of findings.  Medical management recommended.   Assessment and Plan: * NSTEMI (non-ST elevated myocardial infarction) (HCC) Presented with chest pain/pressure, elevated troponins and T wave inversions on EKG.  Taken to 6/14, no intervention unable to be performed to occluded bypass graft diagonal IRA SVG.   CTA chest was negative for PE -- [facility] cardiology consulted - Treated with IV heparin for 48 hours -- Continue aspirin Plavix, metoprolol, high intensity Lipitor, ramipril --Started on Indocin twice daily for 10 days for potential Dressler syndrome -- Pain control as needed -- Follow-up with cardiology in 1 to 2 weeks  CAD (coronary artery disease) of bypass graft Hx of CABG in December  2000. Presented with chest pain, elevated troponins and T wave inversions on EKG. Management per cardiology.  Hyperlipidemia Continue Lipitor 80 mg  Essential hypertension BPs have been labile since admission -- Continue metoprolol, ramipril -- PO hydralazine as needed  Left-sided chest pain-resolved as of 08/01/2021 Management as outlined, per cardiology         Consultants: Cardiology Procedures performed: Left heart cath Disposition: Home Diet recommendation:  Cardiac diet DISCHARGE MEDICATION: Allergies as of 07/29/2021   No Known Allergies      Medication List     STOP taking these medications    atorvastatin 80 MG tablet Commonly known as: LIPITOR       TAKE these medications    aspirin EC 81 MG tablet Take 81 mg by mouth daily.   Cholecalciferol 50 MCG (2000 UT) Caps Take 2,000 Units by mouth daily.   clopidogrel 75 MG tablet Commonly known as: PLAVIX Take 1 tablet (75 mg total) by mouth daily with breakfast.   co-enzyme Q-10 30 MG capsule Take 300 mg by mouth daily.   indomethacin 25 MG capsule Commonly known as: INDOCIN Take 1 capsule (25 mg total) by mouth 2 (two) times daily with a meal for 10 days.   isosorbide mononitrate 60 MG 24 hr tablet Commonly known as: IMDUR Take 1 tablet (60 mg total) by mouth daily.   magnesium gluconate 500 MG tablet Commonly known as: MAGONATE Take 500 mg by mouth daily.   metoprolol tartrate 50 MG tablet Commonly known as: LOPRESSOR Take 1 tablet (50 mg total) by mouth 2 (two) times daily. What changed:  medication strength how much  to take   oxyCODONE-acetaminophen 5-325 MG tablet Commonly known as: PERCOCET/ROXICET Take 1-2 tablets by mouth every 6 (six) hours as needed for moderate pain or severe pain.   ramipril 10 MG capsule Commonly known as: ALTACE Take 1 capsule (10 mg total) by mouth daily.   rosuvastatin 40 MG tablet Commonly known as: CRESTOR Take 1 tablet (40 mg total) by mouth  at bedtime.        Discharge Exam: Filed Weights   07/27/21 0924 07/27/21 1454  Weight: 99 kg 99 kg   General exam: awake, alert, no acute distress HEENT: atraumatic, clear conjunctiva, anicteric sclera, moist mucus membranes, hearing grossly normal  Respiratory system: CTAB, no wheezes, rales or rhonchi, normal respiratory effort. Cardiovascular system: normal S1/S2, RRR, no JVD, murmurs, rubs, gallops,  no pedal edema.   Gastrointestinal system: soft, NT, ND, no HSM felt, +bowel sounds. Central nervous system: A&O x3. no gross focal neurologic deficits, normal speech Extremities: moves all, no edema, normal tone Skin: dry, intact, normal temperature, normal color, No rashes, lesions or ulcers Psychiatry: normal mood, congruent affect, judgement and insight appear normal   Condition at discharge: stable  The results of significant diagnostics from this hospitalization (including imaging, microbiology, ancillary and laboratory) are listed below for reference.   Imaging Studies: CARDIAC CATHETERIZATION  Result Date: 07/27/2021   Mid LAD lesion is 100% stenosed.   Ost Cx to Prox Cx lesion is 90% stenosed.   Mid Cx lesion is 100% stenosed.   Origin to Prox Graft lesion is 100% stenosed.   Origin lesion is 100% stenosed.   Prox Cx to Mid Cx lesion is 100% stenosed.   Non-stenotic Mid RCA lesion was previously treated.   Non-stenotic Prox RCA lesion was previously treated.   Non-stenotic Prox Graft lesion was previously treated.   There is mild left ventricular systolic dysfunction.   LV end diastolic pressure is mildly elevated.   The left ventricular ejection fraction is 45-50% by visual estimate. Conclusion Successful left heart cath from right groin Left ventriculogram showed mildly depressed ventricular function with anterior hypokinesis EF around 45 to 50% Coronaries Native Left main large free of disease LAD large 100% proximal occluded Circumflex large 99% ostial 100% mid occluded RCA  large widely patent widely patent stents in the proximal to mid segment Moderate disease in the PL 50-75% TIMI-3 flow Extensive collaterals right to left Grafts LIMA to mid LAD widely patent distal LAD diffuse 50 to 75% lesions distally the mid 3 flow SVG to circumflex 100% occluded CTO from previously TIMI 0 flow SVG to diagonal 100% occluded with heavy thrombus at the origin and proximal graft recent occlusion, IRA totally occluded mid body stent TIMI 0 flow Intervention deferred Patient tolerated procedure well Minx deployed Recommend medical therapy   CT Angio Chest Pulmonary Embolism (PE) W or WO Contrast  Result Date: 07/27/2021 CLINICAL DATA:  Left-sided pleuritic chest pain for several days. High clinical suspicion for pulmonary embolism. EXAM: CT ANGIOGRAPHY CHEST WITH CONTRAST TECHNIQUE: Multidetector CT imaging of the chest was performed using the standard protocol during bolus administration of intravenous contrast. Multiplanar CT image reconstructions and MIPs were obtained to evaluate the vascular anatomy. RADIATION DOSE REDUCTION: This exam was performed according to the departmental dose-optimization program which includes automated exposure control, adjustment of the mA and/or kV according to patient size and/or use of iterative reconstruction technique. CONTRAST:  63mL OMNIPAQUE IOHEXOL 350 MG/ML SOLN COMPARISON:  09/17/2019 FINDINGS: Cardiovascular: Satisfactory opacification of pulmonary arteries noted, and  no pulmonary emboli identified. No evidence of thoracic aortic dissection or aneurysm. Aortic atherosclerotic calcification incidentally noted. Prior CABG again demonstrated. Mediastinum/Nodes: No masses or pathologically enlarged lymph nodes identified. Lungs/Pleura: No pulmonary mass, infiltrate, or effusion. Stable 5 mm pulmonary nodule in lingula consistent with benign etiology Upper abdomen: No acute findings. Musculoskeletal: No suspicious bone lesions identified. Review of the MIP  images confirms the above findings. IMPRESSION: No evidence of pulmonary embolism or other active disease. Aortic Atherosclerosis (ICD10-I70.0). Electronically Signed   By: Danae Orleans M.D.   On: 07/27/2021 12:13   DG Chest Portable 1 View  Result Date: 07/27/2021 CLINICAL DATA:  Left-sided chest pressure beginning 4 days ago. EXAM: PORTABLE CHEST 1 VIEW COMPARISON:  07/24/2021 FINDINGS: Previous median sternotomy and CABG. Heart size upper limits of normal. Mild aortic atherosclerosis. The lungs are clear. The vascularity is normal. No edema or effusions. IMPRESSION: Previous CABG.  No active disease. Electronically Signed   By: Paulina Fusi M.D.   On: 07/27/2021 10:06   DG Chest 1 View  Result Date: 07/24/2021 CLINICAL DATA:  Left-sided chest pressure x2 days. EXAM: CHEST  1 VIEW COMPARISON:  September 17, 2019 FINDINGS: Multiple sternal wires and vascular clips are seen. The heart size and mediastinal contours are within normal limits. There is no evidence of acute infiltrate, pleural effusion or pneumothorax. The visualized skeletal structures are unremarkable. IMPRESSION: 1. Evidence of prior median sternotomy/CABG. 2. No acute or active cardiopulmonary disease. Electronically Signed   By: Aram Candela M.D.   On: 07/24/2021 23:45    Microbiology: Results for orders placed or performed during the hospital encounter of 09/17/19  SARS Coronavirus 2 by RT PCR (hospital order, performed in [facility] hospital lab) Nasopharyngeal Nasopharyngeal Swab     Status: None   Collection Time: 09/17/19 11:39 AM   Specimen: Nasopharyngeal Swab  Result Value Ref Range Status   SARS Coronavirus 2 NEGATIVE NEGATIVE Final    Comment: (NOTE) SARS-CoV-2 target nucleic acids are NOT DETECTED.  The SARS-CoV-2 RNA is generally detectable in upper and lower respiratory specimens during the acute phase of infection. The lowest concentration of SARS-CoV-2 viral copies this assay can detect is 250 copies / mL. A  negative result does not preclude SARS-CoV-2 infection and should not be used as the sole basis for treatment or other patient management decisions.  A negative result may occur with improper specimen collection / handling, submission of specimen other than nasopharyngeal swab, presence of viral mutation(s) within the areas targeted by this assay, and inadequate number of viral copies (<250 copies / mL). A negative result must be combined with clinical observations, patient history, and epidemiological information.  Fact Sheet for Patients:   BoilerBrush.com.cy  Fact Sheet for Healthcare Providers: https://pope.com/  This test is not yet approved or  cleared by the [place] FDA and has been authorized for detection and/or diagnosis of SARS-CoV-2 by FDA under an Emergency Use Authorization (EUA).  This EUA will remain in effect (meaning this test can be used) for the duration of the COVID-19 declaration under Section 564(b)(1) of the Act, 21 U.S.C. section 360bbb-3(b)(1), unless the authorization is terminated or revoked sooner.  Performed at [facility], [address] [place], [place] [address]     Labs: CBC: Recent Labs  Lab 07/27/21 0955 07/28/21 0419  WBC 12.0* 9.0  NEUTROABS 8.3*  --   HGB 15.2 14.3  HCT 45.9 42.0  MCV 91.3 90.5  PLT 295 226   Basic Metabolic  Panel: Recent Labs  Lab 07/27/21 0955 07/28/21 0419 07/29/21 0550  NA 134* 138 136  K 4.0 3.7 4.3  CL 103 107 103  CO2 20* 25 26  GLUCOSE 115* 131* 105*  BUN 18 12 10   CREATININE 1.14 0.78 0.68  CALCIUM 9.3 8.7* 9.0  MG  --   --  1.9   Liver Function Tests: Recent Labs  Lab 07/27/21 0955  AST 55*  ALT 32  ALKPHOS 26*  BILITOT 1.3*  PROT 7.5  ALBUMIN 4.3   CBG: No results for input(s): "GLUCAP" in the last 168 hours.  Discharge time spent: less than 30 minutes.  Signed: 07/29/21, DO [facility] 08/01/2021

## 2021-07-29 NOTE — Plan of Care (Signed)

## 2021-07-29 NOTE — Progress Notes (Signed)
[facility] Cardiology    SUBJECTIVE: Patient states he feels much improved reduced chest pain tightness shortness of breath ambulating in the halls well has reduced need for pain medications feels well enough to be discharged home and follow-up as an outpatient   Vitals:   07/28/21 1907 07/28/21 2326 07/29/21 0354 07/29/21 0805  BP: 111/69 107/65 95/68 (!) 104/58  Pulse: 60 66 61 (!) 56  Resp: 18 18 19 17   Temp: 98 F (36.7 C) 98.7 F (37.1 C) 98 F (36.7 C) 97.6 F (36.4 C)  TempSrc: Oral Oral Oral   SpO2: 98% 96% 95% 97%  Weight:      Height:         Intake/Output Summary (Last 24 hours) at 07/29/2021 1125 Last data filed at 07/28/2021 2300 Gross per 24 hour  Intake 280 ml  Output 0 ml  Net 280 ml      PHYSICAL EXAM  General: Well developed, well nourished, in no acute distress HEENT:  Normocephalic and atramatic Neck:  No JVD.  Lungs: Clear bilaterally to auscultation and percussion. Heart: HRRR . Normal S1 and S2 without gallops or murmurs.  Abdomen: Bowel sounds are positive, abdomen soft and non-tender  Msk:  Back normal, normal gait. Normal strength and tone for age. Extremities: No clubbing, cyanosis or edema.   Neuro: Alert and oriented X 3. Psych:  Good affect, responds appropriately   LABS: Basic Metabolic Panel: Recent Labs    07/28/21 0419 07/29/21 0550  NA 138 136  K 3.7 4.3  CL 107 103  CO2 25 26  GLUCOSE 131* 105*  BUN 12 10  CREATININE 0.78 0.68  CALCIUM 8.7* 9.0  MG  --  1.9   Liver Function Tests: Recent Labs    07/27/21 0955  AST 55*  ALT 32  ALKPHOS 26*  BILITOT 1.3*  PROT 7.5  ALBUMIN 4.3   No results for input(s): "LIPASE", "AMYLASE" in the last 72 hours. CBC: Recent Labs    07/27/21 0955 07/28/21 0419  WBC 12.0* 9.0  NEUTROABS 8.3*  --   HGB 15.2 14.3  HCT 45.9 42.0  MCV 91.3 90.5  PLT 295 226   Cardiac Enzymes: No results for input(s): "CKTOTAL", "CKMB", "CKMBINDEX", "TROPONINI" in the last 72  hours. BNP: Invalid input(s): "POCBNP" D-Dimer: No results for input(s): "DDIMER" in the last 72 hours. Hemoglobin A1C: No results for input(s): "HGBA1C" in the last 72 hours. Fasting Lipid Panel: No results for input(s): "CHOL", "HDL", "LDLCALC", "TRIG", "CHOLHDL", "LDLDIRECT" in the last 72 hours. Thyroid Function Tests: No results for input(s): "TSH", "T4TOTAL", "T3FREE", "THYROIDAB" in the last 72 hours.  Invalid input(s): "FREET3" Anemia Panel: No results for input(s): "VITAMINB12", "FOLATE", "FERRITIN", "TIBC", "IRON", "RETICCTPCT" in the last 72 hours.  CARDIAC CATHETERIZATION  Result Date: 07/27/2021   Mid LAD lesion is 100% stenosed.   Ost Cx to Prox Cx lesion is 90% stenosed.   Mid Cx lesion is 100% stenosed.   Origin to Prox Graft lesion is 100% stenosed.   Origin lesion is 100% stenosed.   Prox Cx to Mid Cx lesion is 100% stenosed.   Non-stenotic Mid RCA lesion was previously treated.   Non-stenotic Prox RCA lesion was previously treated.   Non-stenotic Prox Graft lesion was previously treated.   There is mild left ventricular systolic dysfunction.   LV end diastolic pressure is mildly elevated.   The left ventricular ejection fraction is 45-50% by visual estimate. Conclusion Successful left heart cath from right groin Left ventriculogram  showed mildly depressed ventricular function with anterior hypokinesis EF around 45 to 50% Coronaries Native Left main large free of disease LAD large 100% proximal occluded Circumflex large 99% ostial 100% mid occluded RCA large widely patent widely patent stents in the proximal to mid segment Moderate disease in the PL 50-75% TIMI-3 flow Extensive collaterals right to left Grafts LIMA to mid LAD widely patent distal LAD diffuse 50 to 75% lesions distally the mid 3 flow SVG to circumflex 100% occluded CTO from previously TIMI 0 flow SVG to diagonal 100% occluded with heavy thrombus at the origin and proximal graft recent occlusion, IRA totally  occluded mid body stent TIMI 0 flow Intervention deferred Patient tolerated procedure well Minx deployed Recommend medical therapy   CT Angio Chest Pulmonary Embolism (PE) W or WO Contrast  Result Date: 07/27/2021 CLINICAL DATA:  Left-sided pleuritic chest pain for several days. High clinical suspicion for pulmonary embolism. EXAM: CT ANGIOGRAPHY CHEST WITH CONTRAST TECHNIQUE: Multidetector CT imaging of the chest was performed using the standard protocol during bolus administration of intravenous contrast. Multiplanar CT image reconstructions and MIPs were obtained to evaluate the vascular anatomy. RADIATION DOSE REDUCTION: This exam was performed according to the departmental dose-optimization program which includes automated exposure control, adjustment of the mA and/or kV according to patient size and/or use of iterative reconstruction technique. CONTRAST:  101mL OMNIPAQUE IOHEXOL 350 MG/ML SOLN COMPARISON:  09/17/2019 FINDINGS: Cardiovascular: Satisfactory opacification of pulmonary arteries noted, and no pulmonary emboli identified. No evidence of thoracic aortic dissection or aneurysm. Aortic atherosclerotic calcification incidentally noted. Prior CABG again demonstrated. Mediastinum/Nodes: No masses or pathologically enlarged lymph nodes identified. Lungs/Pleura: No pulmonary mass, infiltrate, or effusion. Stable 5 mm pulmonary nodule in lingula consistent with benign etiology Upper abdomen: No acute findings. Musculoskeletal: No suspicious bone lesions identified. Review of the MIP images confirms the above findings. IMPRESSION: No evidence of pulmonary embolism or other active disease. Aortic Atherosclerosis (ICD10-I70.0). Electronically Signed   By: Danae Orleans M.D.   On: 07/27/2021 12:13     Echo pending can be done as an outpatient  TELEMETRY: Normal sinus rhythm rate of: 70  ASSESSMENT AND PLAN:  Principal Problem:   NSTEMI (non-ST elevated myocardial infarction) (HCC) Active Problems:    Left-sided chest pain   Essential hypertension   Hyperlipidemia   CAD (coronary artery disease) of bypass graft    Plan  Non-STEMI patient much improved with improved chest pain tightness symptoms ambulating without difficulty with consider discharge today Status post cardiac cath with occlusion of bypass graft to diagonal IRA SVG graft not amenable to percutaneous intervention Recommend aggressive medical therapy on discharge Continue Crestor therapy 40 mg a day for lipid management Post MI therapy with aspirin 81 mg a day Plavix 75 mg a day Crestor 40 a day also continue ramipril metoprolol Referred patient to cardiac rehab post MI 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

## 2021-07-29 NOTE — Progress Notes (Signed)
Order to discharge pt home.  Discharge instructions/AVS given to patient and reviewed - education provided as needed.  Pt advised to call PCP and/or come back to the hospital if there are any problems. Pt verbalized understanding.    

## 2021-08-01 ENCOUNTER — Encounter: Payer: Self-pay | Admitting: Internal Medicine

## 2021-12-21 ENCOUNTER — Other Ambulatory Visit: Payer: Self-pay | Admitting: Orthopedic Surgery

## 2021-12-27 ENCOUNTER — Other Ambulatory Visit: Payer: 59

## 2022-01-02 ENCOUNTER — Encounter: Admission: RE | Payer: Self-pay | Source: Home / Self Care

## 2022-01-02 ENCOUNTER — Ambulatory Visit: Admission: RE | Admit: 2022-01-02 | Payer: 59 | Source: Home / Self Care | Admitting: Orthopedic Surgery

## 2022-01-02 SURGERY — ARTHROPLASTY, KNEE, TOTAL
Anesthesia: Choice | Site: Knee | Laterality: Right

## 2022-07-03 ENCOUNTER — Ambulatory Visit: Payer: 59 | Attending: Family Medicine

## 2022-07-03 DIAGNOSIS — G8929 Other chronic pain: Secondary | ICD-10-CM | POA: Diagnosis present

## 2022-07-03 DIAGNOSIS — M25661 Stiffness of right knee, not elsewhere classified: Secondary | ICD-10-CM | POA: Insufficient documentation

## 2022-07-03 DIAGNOSIS — M25561 Pain in right knee: Secondary | ICD-10-CM | POA: Insufficient documentation

## 2022-07-03 NOTE — Therapy (Signed)
OUTPATIENT PHYSICAL THERAPY LOWER EXTREMITY EVALUATION   Patient Name: Curtis Cooke MRN: [id number] DOB:03-24-1960, 62 y.o., male Today's Date: 07/03/2022  END OF SESSION:  PT End of Session - 07/03/22 1638     Visit Number 1    Number of Visits 1    PT Start Time 1034    PT Stop Time 1115    PT Time Calculation (min) 41 min             Past Medical History:  Diagnosis Date   Coronary artery disease    Hypertension    Left-sided chest pain 09/17/2019   Past Surgical History:  Procedure Laterality Date   CARDIAC SURGERY     CORONARY STENT INTERVENTION N/A 09/18/2019   Procedure: CORONARY STENT INTERVENTION;  Surgeon: Alwyn Pea, MD;  Location: [facility] INVASIVE CV LAB;  Service: Cardiovascular;  Laterality: N/A;   LEFT HEART CATH AND CORONARY ANGIOGRAPHY N/A 07/27/2021   Procedure: LEFT HEART CATH AND CORONARY ANGIOGRAPHY;  Surgeon: Alwyn Pea, MD;  Location: [facility] INVASIVE CV LAB;  Service: Cardiovascular;  Laterality: N/A;   LEFT HEART CATH AND CORS/GRAFTS ANGIOGRAPHY N/A 09/18/2019   Procedure: LEFT HEART CATH AND CORS/GRAFTS ANGIOGRAPHY;  Surgeon: Dalia Heading, MD;  Location: [facility] INVASIVE CV LAB;  Service: Cardiovascular;  Laterality: N/A;   Patient Active Problem List   Diagnosis Date Noted   NSTEMI (non-ST elevated myocardial infarction) (HCC) 07/27/2021   Essential hypertension 07/27/2021   Hyperlipidemia 07/27/2021   CAD (coronary artery disease) of bypass graft 07/27/2021    PCP: Marisue Ivan, MD  REFERRING PROVIDER: Marisue Ivan, MD   REFERRING DIAG:   M25.561 (ICD-10-CM) - Pain in right knee  G89.29 (ICD-10-CM) - Other chronic pain    THERAPY DIAG:  Chronic pain of right knee  Decreased ROM of right knee  Rationale for Evaluation and Treatment: Rehabilitation  ONSET DATE: Years  SUBJECTIVE:   SUBJECTIVE STATEMENT:  Pt reports he started having knee pain years ago over the R knee.  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  He also has had cortisone injections and also a series of 3 other injections that he was hoping would be the fix.  It did not work and he was scheduled to perform a knee surgery but 3 days prior to the surgery it was cancelled due to insurance not covering out-of-network surgery.  Pt has to be driven to and picked up from work and he has to utilize heat while at work in order to work.  Pt can't stand at church on Sunday very easily.    Rainy and cold make it feel worse    PERTINENT HISTORY:  No information found in chart due to coming from [facility] electronic system   PAIN:  Are you having pain? Yes: NPRS scale: 6/10 Pain location: R knee Pain description: Varies Aggravating factors: Walking, stairs, standing for prolonged periods of time Relieving factors: Pain medication  PRECAUTIONS: Knee  WEIGHT BEARING RESTRICTIONS: No  FALLS:  Has patient fallen in last 6 months? Yes. Number of falls never fallen completely, but has has some near falls due to instability of the knee.  LIVING ENVIRONMENT: Lives with: lives with their spouse Lives in: House/apartment Stairs: Yes: Internal: 13 steps;   Has following equipment at home: None  OCCUPATION: Therapist, occupational at [employer]  PLOF: Independent  PATIENT GOALS: To have knee surgery and improve with therapy/  NEXT MD VISIT: Not sure  OBJECTIVE:   DIAGNOSTIC FINDINGS: Unable to pull imaging from electronic file  PATIENT SURVEYS: FOTO Not performed at this time.  PALPATION: Soreness noted in the medial compartment of the R knee.  LOWER EXTREMITY ROM:  AROM Right eval Left eval  Hip flexion    Hip extension    Hip abduction    Hip adduction    Hip internal rotation    Hip external rotation    Knee flexion 90 120  Knee extension 9 deg lacking 0 deg  Ankle dorsiflexion    Ankle plantarflexion    Ankle inversion    Ankle eversion      (Blank rows = not  tested)   LOWER EXTREMITY MMT:  MMT Right eval Left eval  Hip flexion 3+ 5  Hip extension    Hip abduction 4- 5  Hip adduction 4- 5  Hip internal rotation    Hip external rotation    Knee flexion 4- 5  Knee extension 4- 5  Ankle dorsiflexion 5 5  Ankle plantarflexion    Ankle inversion    Ankle eversion      (Blank rows = not tested)    GAIT: Distance walked: 10' Assistive device utilized: None Level of assistance: Complete Independence Comments: Pt ambulates with significant antalgic gait of the R LE.     TODAY'S TREATMENT: DATE: 07/03/22  Eval Only     PATIENT EDUCATION: Education details: Pt educated on role of PT and services provided during current POC, along with prognosis and information about the clinic.  Person educated: Patient Education method: Explanation Education comprehension: verbalized understanding  HOME EXERCISE PROGRAM:  Access Code: [email or web address] URL: [email or web address] Date: 07/03/2022 Prepared by: Tomasa Hose  Exercises - Supine Bridge  - 1 x daily - 7 x weekly - 3 sets - 10 reps - Supine Active Straight Leg Raise  - 1 x daily - 7 x weekly - 3 sets - 10 reps - Seated Long Arc Quad  - 1 x daily - 7 x weekly - 3 sets - 10 reps - Squat  - 1 x daily - 7 x weekly - 3 sets - 10 reps - Sit to Stand with Arms Crossed  - 1 x daily - 7 x weekly - 3 sets - 10 reps   ASSESSMENT:  CLINICAL IMPRESSION: Patient is a 62 y.o. male who was seen today for physical therapy evaluation and treatment for R knee pain.  Pt currently demonstrates significant pain int he R knee with mobility and any other task that involves moving the knee.  Pt has limited ROM and strength in the R LE as noted above.  Pt is not currently appropriate for therapy at this time due to inability to complete the activities asked of him without significant pain and having to self-medicate.  Pt will be discharged from clinic at this time and return following  orthopaedic intervention.    OBJECTIVE IMPAIRMENTS: Abnormal gait, decreased activity tolerance, decreased balance, decreased endurance, decreased mobility, difficulty walking, decreased ROM, decreased strength, hypomobility, impaired flexibility, and pain.   ACTIVITY LIMITATIONS: carrying, lifting, bending, sitting, standing, squatting, sleeping, stairs, and transfers  PARTICIPATION LIMITATIONS: cleaning, laundry, driving, shopping, community activity, occupation, yard work, and church  PERSONAL FACTORS: Age, Education, Financial risk analyst, Past/current experiences, Profession, Time since onset of injury/illness/exacerbation, and 1-2 comorbidities: CAD, HTN  are also affecting patient's functional outcome.   REHAB POTENTIAL: Good  CLINICAL DECISION MAKING: Stable/uncomplicated  EVALUATION COMPLEXITY: Low   GOALS: Goals reviewed with patient? Yes  SHORT TERM GOALS: Target date: 07/31/2022  Pt will be independent with HEP in order to demonstrate increased ability to perform tasks related to occupation/hobbies. Baseline: Given exercises to perform between now and surgical procedure. Goal status: INITIAL   PLAN:  PT FREQUENCY: one time visit  PT DURATION:  1 sessions  PLANNED INTERVENTIONS: Therapeutic exercises, Therapeutic activity, Neuromuscular re-education, Balance training, Gait training, Patient/Family education, Self Care, Joint mobilization, Joint manipulation, Stair training, Vestibular training, Dry Needling, Cryotherapy, Moist heat, scar mobilization, Ultrasound, Manual therapy, and Re-evaluation  PLAN FOR NEXT SESSION: one time visit until pt has surgical intervention    Nolon Bussing, PT, DPT Physical Therapist - [facility]  07/03/22, 4:42 PM

## 2022-07-05 ENCOUNTER — Ambulatory Visit: Payer: 59

## 2022-07-06 NOTE — Addendum Note (Signed)
Addended by: Phineas Real on: 07/06/2022 08:58 AM   Modules accepted: Orders

## 2022-10-06 ENCOUNTER — Other Ambulatory Visit: Payer: Self-pay | Admitting: Orthopedic Surgery

## 2022-10-12 ENCOUNTER — Other Ambulatory Visit: Payer: Self-pay | Admitting: Orthopedic Surgery

## 2022-10-13 ENCOUNTER — Other Ambulatory Visit: Payer: Self-pay

## 2022-10-13 ENCOUNTER — Encounter
Admission: RE | Admit: 2022-10-13 | Discharge: 2022-10-13 | Disposition: A | Discharge status: Home / Self Care | Payer: 59 | Source: Ambulatory Visit | Attending: Orthopedic Surgery | Admitting: Orthopedic Surgery

## 2022-10-13 VITALS — BP 149/78 | HR 97 | Resp 16 | Ht 67.0 in | Wt 223.0 lb

## 2022-10-13 DIAGNOSIS — Z01818 Encounter for other preprocedural examination: Secondary | ICD-10-CM | POA: Diagnosis present

## 2022-10-13 DIAGNOSIS — Z01812 Encounter for preprocedural laboratory examination: Secondary | ICD-10-CM

## 2022-10-13 DIAGNOSIS — I444 Left anterior fascicular block: Secondary | ICD-10-CM | POA: Insufficient documentation

## 2022-10-13 HISTORY — DX: COVID-19: U07.1

## 2022-10-13 HISTORY — DX: Unilateral primary osteoarthritis, right knee: M17.11

## 2022-10-13 HISTORY — DX: Sleep apnea, unspecified: G47.30

## 2022-10-13 HISTORY — DX: Pneumonia, unspecified organism: J18.9

## 2022-10-13 HISTORY — DX: Acute myocardial infarction, unspecified: I21.9

## 2022-10-13 HISTORY — DX: Angina pectoris, unspecified: I20.9

## 2022-10-13 HISTORY — DX: Hyperlipidemia, unspecified: E78.5

## 2022-10-13 HISTORY — DX: Atherosclerosis of coronary artery bypass graft(s) without angina pectoris: I25.810

## 2022-10-13 HISTORY — DX: Prediabetes: R73.03

## 2022-10-13 HISTORY — DX: Dyspnea, unspecified: R06.00

## 2022-10-13 HISTORY — DX: Other specified postprocedural states: Z98.890

## 2022-10-13 HISTORY — DX: Presence of aortocoronary bypass graft: Z95.1

## 2022-10-13 LAB — SURGICAL PCR SCREEN
MRSA, PCR: NEGATIVE
Staphylococcus aureus: NEGATIVE

## 2022-10-13 LAB — CBC WITH DIFFERENTIAL/PLATELET
Abs Immature Granulocytes: 0.04 10*3/uL (ref 0.00–0.07)
Basophils Absolute: 0 10*3/uL (ref 0.0–0.1)
Basophils Relative: 0 %
Eosinophils Absolute: 0.2 10*3/uL (ref 0.0–0.5)
Eosinophils Relative: 2 %
HCT: 41.3 % (ref 39.0–52.0)
Hemoglobin: 14.7 g/dL (ref 13.0–17.0)
Immature Granulocytes: 0 %
Lymphocytes Relative: 24 %
Lymphs Abs: 2.3 10*3/uL (ref 0.7–4.0)
MCH: 31 pg (ref 26.0–34.0)
MCHC: 35.6 g/dL (ref 30.0–36.0)
MCV: 87.1 fL (ref 80.0–100.0)
Monocytes Absolute: 0.7 10*3/uL (ref 0.1–1.0)
Monocytes Relative: 8 %
Neutro Abs: 6.2 10*3/uL (ref 1.7–7.7)
Neutrophils Relative %: 66 %
Platelets: 267 10*3/uL (ref 150–400)
RBC: 4.74 MIL/uL (ref 4.22–5.81)
RDW: 12.4 % (ref 11.5–15.5)
WBC: 9.4 10*3/uL (ref 4.0–10.5)
nRBC: 0 % (ref 0.0–0.2)

## 2022-10-13 LAB — TYPE AND SCREEN
ABO/RH(D): O POS
Antibody Screen: NEGATIVE

## 2022-10-13 LAB — COMPREHENSIVE METABOLIC PANEL
ALT: 43 U/L (ref 0–44)
AST: 21 U/L (ref 15–41)
Albumin: 4.8 g/dL (ref 3.5–5.0)
Alkaline Phosphatase: 28 U/L — ABNORMAL LOW (ref 38–126)
Anion gap: 11 (ref 5–15)
BUN: 13 mg/dL (ref 8–23)
CO2: 23 mmol/L (ref 22–32)
Calcium: 9.8 mg/dL (ref 8.9–10.3)
Chloride: 101 mmol/L (ref 98–111)
Creatinine, Ser: 0.9 mg/dL (ref 0.61–1.24)
GFR, Estimated: >60 mL/min (ref >60)
Glucose, Bld: 111 mg/dL — ABNORMAL HIGH (ref 70–99)
Potassium: 4.1 mmol/L (ref 3.5–5.1)
Sodium: 135 mmol/L (ref 135–145)
Total Bilirubin: 1 mg/dL (ref 0.3–1.2)
Total Protein: 8.4 g/dL — ABNORMAL HIGH (ref 6.5–8.1)

## 2022-10-13 LAB — URINALYSIS, ROUTINE W REFLEX MICROSCOPIC
Bilirubin Urine: NEGATIVE
Glucose, UA: NEGATIVE mg/dL
Hgb urine dipstick: NEGATIVE
Ketones, ur: 20 mg/dL — AB
Leukocytes,Ua: NEGATIVE
Nitrite: NEGATIVE
Protein, ur: NEGATIVE mg/dL
Specific Gravity, Urine: 1.013 (ref 1.005–1.030)
pH: 5 (ref 5.0–8.0)

## 2022-10-13 NOTE — Patient Instructions (Addendum)
Your procedure is scheduled on: 10/26/22 - Thursday Report to the Registration Desk on the 1st floor of the [facility]. To find out your arrival time, please call [phone] between 1PM - 3PM on: 10/25/22 - Wednesday If your arrival time is 6:00 am, do not arrive before that time as the [facility] entrance doors do not open until 6:00 am.  REMEMBER: Instructions that are not followed completely may result in serious medical risk, up to and including death; or upon the discretion of your surgeon and anesthesiologist your surgery may need to be rescheduled.  Do not eat food after midnight the night before surgery.  No gum chewing or hard candies.  You may however, drink CLEAR liquids up to 2 hours before you are scheduled to arrive for your surgery. Do not drink anything within 2 hours of your scheduled arrival time.  Clear liquids include: - water  - apple juice without pulp - gatorade (not RED colors) - black coffee or tea (Do NOT add milk or creamers to the coffee or tea) Do NOT drink anything that is not on this list.  In addition, your doctor has ordered for you to drink the provided:  Ensure Pre-Surgery Clear Carbohydrate Drink  Drinking this carbohydrate drink up to two hours before surgery helps to reduce insulin resistance and improve patient outcomes. Please complete drinking 2 hours before scheduled arrival time.  One week prior to surgery: Stop Anti-inflammatories (NSAIDS) such as Advil, Aleve, Ibuprofen, Motrin, Naproxen, Naprosyn and Aspirin based products such as Excedrin, Goody's Powder, BC Powder.  Stop ANY OVER THE COUNTER supplements until after surgery.  You may however, continue to take Tylenol if needed for pain up until the day of surgery.  Continue taking all prescribed medications with the exception of the following:  Hold ramipril (ALTACE) on the day of surgery.      TAKE ONLY THESE MEDICATIONS THE MORNING OF SURGERY WITH A SIP OF  WATER:  isosorbide mononitrate (IMDUR)  metoprolol tartrate (LOPRESSOR)    No Alcohol for 24 hours before or after surgery.  No Smoking including e-cigarettes for 24 hours before surgery.  No chewable tobacco products for at least 6 hours before surgery.  No nicotine patches on the day of surgery.  Do not use any "recreational" drugs for at least a week (preferably 2 weeks) before your surgery.  Please be advised that the combination of cocaine and anesthesia may have negative outcomes, up to and including death. If you test positive for cocaine, your surgery will be cancelled.  On the morning of surgery brush your teeth with toothpaste and water, you may rinse your mouth with mouthwash if you wish. Do not swallow any toothpaste or mouthwash.  Use CHG Soap or wipes as directed on instruction sheet.  Do not wear jewelry, make-up, hairpins, clips or nail polish.  Do not wear lotions, powders, or perfumes.   Do not shave body hair from the neck down 48 hours before surgery.  Contact lenses, hearing aids and dentures may not be worn into surgery.  Do not bring valuables to the hospital. [facility] is not responsible for any missing/lost belongings or valuables.   Notify your doctor if there is any change in your medical condition (cold, fever, infection).  Wear comfortable clothing (specific to your surgery type) to the hospital.  After surgery, you can help prevent lung complications by doing breathing exercises.  Take deep breaths and cough every 1-2 hours. Your doctor may order a device  called an Facilities manager to help you take deep breaths. When coughing or sneezing, hold a pillow firmly against your incision with both hands. This is called "splinting." Doing this helps protect your incision. It also decreases belly discomfort.  If you are being admitted to the hospital overnight, leave your suitcase in the car. After surgery it may be brought to your room.  In case of  increased patient census, it may be necessary for you, the patient, to continue your postoperative care in the Same Day Surgery department.  If you are being discharged the day of surgery, you will not be allowed to drive home. You will need a responsible individual to drive you home and stay with you for 24 hours after surgery.   If you are taking public transportation, you will need to have a responsible individual with you.  Please call the Pre-admissions Testing Dept. at [phone] if you have any questions about these instructions.  Surgery Visitation Policy:  Patients having surgery or a procedure may have two visitors.  Children under the age of 11 must have an adult with them who is not the patient.  Inpatient Visitation:    Visiting hours are 7 a.m. to 8 p.m. Up to four visitors are allowed at one time in a patient room. The visitors may rotate out with other people during the day.  One visitor age 63 or older may stay with the patient overnight and must be in the room by 8 p.m.    Pre-operative 5 CHG Bath Instructions   You can play a key role in reducing the risk of infection after surgery. Your skin needs to be as free of germs as possible. You can reduce the number of germs on your skin by washing with CHG (chlorhexidine gluconate) soap before surgery. CHG is an antiseptic soap that kills germs and continues to kill germs even after washing.   DO NOT use if you have an allergy to chlorhexidine/CHG or antibacterial soaps. If your skin becomes reddened or irritated, stop using the CHG and notify one of our RNs at [phone].   Please shower with the CHG soap starting 4 days before surgery using the following schedule: 09/08 - 09/12.    Please keep in mind the following:  DO NOT shave, including legs and underarms, starting the day of your first shower.   You may shave your face at any point before/day of surgery.  Place clean sheets on your bed the day you start  using CHG soap. Use a clean washcloth (not used since being washed) for each shower. DO NOT sleep with pets once you start using the CHG.   CHG Shower Instructions:  If you choose to wash your hair and private area, wash first with your normal shampoo/soap.  After you use shampoo/soap, rinse your hair and body thoroughly to remove shampoo/soap residue.  Turn the water OFF and apply about 3 tablespoons (45 ml) of CHG soap to a CLEAN washcloth.  Apply CHG soap ONLY FROM YOUR NECK DOWN TO YOUR TOES (washing for 3-5 minutes)  DO NOT use CHG soap on face, private areas, open wounds, or sores.  Pay special attention to the area where your surgery is being performed.  If you are having back surgery, having someone wash your back for you may be helpful. Wait 2 minutes after CHG soap is applied, then you may rinse off the CHG soap.  Pat dry with a clean towel  Put on clean clothes/pajamas  If you choose to wear lotion, please use ONLY the CHG-compatible lotions on the back of this paper.     Additional instructions for the day of surgery: DO NOT APPLY any lotions, deodorants, cologne, or perfumes.   Put on clean/comfortable clothes.  Brush your teeth.  Ask your nurse before applying any prescription medications to the skin.      CHG Compatible Lotions   Aveeno Moisturizing lotion  Cetaphil Moisturizing Cream  Cetaphil Moisturizing Lotion  Clairol Herbal Essence Moisturizing Lotion, Dry Skin  Clairol Herbal Essence Moisturizing Lotion, Extra Dry Skin  Clairol Herbal Essence Moisturizing Lotion, Normal Skin  Curel Age Defying Therapeutic Moisturizing Lotion with Alpha Hydroxy  Curel Extreme Care Body Lotion  Curel Soothing Hands Moisturizing Hand Lotion  Curel Therapeutic Moisturizing Cream, Fragrance-Free  Curel Therapeutic Moisturizing Lotion, Fragrance-Free  Curel Therapeutic Moisturizing Lotion, Original Formula  Eucerin Daily Replenishing Lotion  Eucerin Dry Skin Therapy Plus  Alpha Hydroxy Crme  Eucerin Dry Skin Therapy Plus Alpha Hydroxy Lotion  Eucerin Original Crme  Eucerin Original Lotion  Eucerin Plus Crme Eucerin Plus Lotion  Eucerin TriLipid Replenishing Lotion  Keri Anti-Bacterial Hand Lotion  Keri Deep Conditioning Original Lotion Dry Skin Formula Softly Scented  Keri Deep Conditioning Original Lotion, Fragrance Free Sensitive Skin Formula  Keri Lotion Fast Absorbing Fragrance Free Sensitive Skin Formula  Keri Lotion Fast Absorbing Softly Scented Dry Skin Formula  Keri Original Lotion  Keri Skin Renewal Lotion Keri Silky Smooth Lotion  Keri Silky Smooth Sensitive Skin Lotion  Nivea Body Creamy Conditioning Oil  Nivea Body Extra Enriched Lotion  Nivea Body Original Lotion  Nivea Body Sheer Moisturizing Lotion Nivea Crme  Nivea Skin Firming Lotion  NutraDerm 30 Skin Lotion  NutraDerm Skin Lotion  NutraDerm Therapeutic Skin Cream  NutraDerm Therapeutic Skin Lotion  ProShield Protective Hand Cream  Provon moisturizing lotion  How to Use an Incentive Spirometer  An incentive spirometer is a tool that measures how well you are filling your lungs with each breath. Learning to take long, deep breaths using this tool can help you keep your lungs clear and active. This may help to reverse or lessen your chance of developing breathing (pulmonary) problems, especially infection. You may be asked to use a spirometer: After a surgery. If you have a lung problem or a history of smoking. After a long period of time when you have been unable to move or be active. If the spirometer includes an indicator to show the highest number that you have reached, your health care provider or respiratory therapist will help you set a goal. Keep a log of your progress as told by your health care provider. What are the risks? Breathing too quickly may cause dizziness or cause you to pass out. Take your time so you do not get dizzy or light-headed. If you are in pain, you  may need to take pain medicine before doing incentive spirometry. It is harder to take a deep breath if you are having pain. How to use your incentive spirometer  Sit up on the edge of your bed or on a chair. Hold the incentive spirometer so that it is in an upright position. Before you use the spirometer, breathe out normally. Place the mouthpiece in your mouth. Make sure your lips are closed tightly around it. Breathe in slowly and as deeply as you can through your mouth, causing the piston or the ball to rise toward the top of the chamber. Hold your breath for 3-5 seconds, or  for as long as possible. If the spirometer includes a coach indicator, use this to guide you in breathing. Slow down your breathing if the indicator goes above the marked areas. Remove the mouthpiece from your mouth and breathe out normally. The piston or ball will return to the bottom of the chamber. Rest for a few seconds, then repeat the steps 10 or more times. Take your time and take a few normal breaths between deep breaths so that you do not get dizzy or light-headed. Do this every 1-2 hours when you are awake. If the spirometer includes a goal marker to show the highest number you have reached (best effort), use this as a goal to work toward during each repetition. After each set of 10 deep breaths, cough a few times. This will help to make sure that your lungs are clear. If you have an incision on your chest or abdomen from surgery, place a pillow or a rolled-up towel firmly against the incision when you cough. This can help to reduce pain while taking deep breaths and coughing. General tips When you are able to get out of bed: Walk around often. Continue to take deep breaths and cough in order to clear your lungs. Keep using the incentive spirometer until your health care provider says it is okay to stop using it. If you have been in the hospital, you may be told to keep using the spirometer at home. Contact a  health care provider if: You are having difficulty using the spirometer. You have trouble using the spirometer as often as instructed. Your pain medicine is not giving enough relief for you to use the spirometer as told. You have a fever. Get help right away if: You develop shortness of breath. You develop a cough with bloody mucus from the lungs. You have fluid or blood coming from an incision site after you cough. Summary An incentive spirometer is a tool that can help you learn to take long, deep breaths to keep your lungs clear and active. You may be asked to use a spirometer after a surgery, if you have a lung problem or a history of smoking, or if you have been inactive for a long period of time. Use your incentive spirometer as instructed every 1-2 hours while you are awake. If you have an incision on your chest or abdomen, place a pillow or a rolled-up towel firmly against your incision when you cough. This will help to reduce pain. Get help right away if you have shortness of breath, you cough up bloody mucus, or blood comes from your incision when you cough. This information is not intended to replace advice given to you by your health care provider. Make sure you discuss any questions you have with your health care provider. Document Revised: 04/21/2019 Document Reviewed: 04/21/2019 Elsevier Patient Education  2023 [employer]  POLAR CARE INFORMATION  MassAdvertisement.it  How to use [facility] Therapy System?  YouTube   ShippingScam.co.uk  OPERATING INSTRUCTIONS  Start the product With dry hands, connect the transformer to the electrical connection located on the top of the cooler. Next, plug the transformer into an appropriate electrical outlet. The unit will automatically start running at this point.  To stop the pump, disconnect electrical power.  Unplug to stop the product when not in use. Unplugging the Polar Care unit turns it  off. Always unplug immediately after use. Never leave it plugged in while unattended. Remove pad.    FIRST  ADD WATER TO FILL LINE, THEN ICE---Replace ice when existing ice is almost melted  1 Discuss Treatment with your Licensed Health Care Practitioner and Use Only as Prescribed 2 Apply Insulation Barrier & Cold Therapy Pad 3 Check for Moisture 4 Inspect Skin Regularly  Tips and Trouble Shooting Usage Tips 1. Use cubed or chunked ice for optimal performance. 2. It is recommended to drain the Pad between uses. To drain the pad, hold the Pad upright with the hose pointed toward the ground. Depress the black plunger and allow water to drain out. 3. You may disconnect the Pad from the unit without removing the pad from the affected area by depressing the silver tabs on the hose coupling and gently pulling the hoses apart. The Pad and unit will seal itself and will not leak. Note: Some dripping during release is normal. 4. DO NOT RUN PUMP WITHOUT WATER! The pump in this unit is designed to run with water. Running the unit without water will cause permanent damage to the pump. 5. Unplug unit before removing lid.  TROUBLESHOOTING GUIDE Pump not running, Water not flowing to the pad, Pad is not getting cold 1. Make sure the transformer is plugged into the wall outlet. 2. Confirm that the ice and water are filled to the indicated levels. 3. Make sure there are no kinks in the pad. 4. Gently pull on the blue tube to make sure the tube/pad junction is straight. 5. Remove the pad from the treatment site and ll it while the pad is lying at; then reapply. 6. Confirm that the pad couplings are securely attached to the unit. Listen for the double clicks (Figure 1) to confirm the pad couplings are securely attached.  Leaks    Note: Some condensation on the lines, controller, and pads is unavoidable, especially in warmer climates. 1. If using a [employer] Polar Care Cold Therapy unit with a detachable Cold  Therapy Pad, and a leak exists (other than condensation on the lines) disconnect the pad couplings. Make sure the silver tabs on the couplings are depressed before reconnecting the pad to the pump hose; then confirm both sides of the coupling are properly clicked in. 2. If the coupling continues to leak or a leak is detected in the pad itself, stop using it and call [employer] Customer Care at [phone].  Cleaning After use, empty and dry the unit with a soft cloth. Warm water and mild detergent may be used occasionally to clean the pump and tubes.  WARNING: The Polar Care Cube can be cold enough to cause serious injury, including full skin necrosis. Follow these Operating Instructions, and carefully read the Product Insert (see pouch on side of unit) and the Cold Therapy Pad Fitting Instructions (provided with each Cold Therapy Pad) prior to use.

## 2022-10-23 ENCOUNTER — Encounter: Payer: Self-pay | Admitting: Orthopedic Surgery

## 2022-10-23 NOTE — Progress Notes (Incomplete)
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 Clinical nursing staff has updated patient's PMH/PSHx, current medication list, and drug allergies/intolerances to ensure comprehensive history available to assist in medical decision making as it pertains to the aforementioned surgical procedure and anticipated anesthetic course. Extensive review of available clinical information personally performed. [place] PMH and PSHx updated with any diagnoses/procedures that  may have been inadvertently omitted during his intake with the pre-admission testing department's nursing staff.  Clinical Discussion:  Curtis Cooke is a 62 y.o. male who is submitted for pre-surgical anesthesia review and clearance prior to him undergoing the above procedure. Patient has never been a smoker. Pertinent PMH includes: CAD (s/p CABG), NSTEMI, angina, aortic atherosclerosis, LAFB, HTN, HLD, prediabetes, dyspnea, sleep apnea (does not require nocturnal PAP therapy), OA, adult ADHD.   Patient is followed by cardiology Juliann Pares, MD). He was last seen in the cardiology clinic on 10/20/2022; notes reviewed. At the time of his clinic visit, patient doing well overall from a cardiovascular perspective. Patient denied any chest pain, shortness of breath, PND, orthopnea, palpitations, significant peripheral edema, weakness, fatigue, vertiginous symptoms, or presyncope/syncope.  Patient with a past medical history significant for cardiovascular diagnoses. Documented physical exam was grossly benign, providing no evidence of acute exacerbation and/or decompensation of the patient's known cardiovascular conditions. Of note, complete records regarding patient's cardiovascular history are unavailable at time of consult. He has received care in the state of [place]. Information gathered from patient/family report, as well as from notes provided by patient's local care providers.    Patient underwent 5 vessel CABG in 2001, again while living in [place].  LIMA-LAD, SVG-D2, LRA-OM1, LRA-OM2, and LRA-OM3 bypass grafts were placed.  Patient reportedly underwent PCI of the mid RCA while living in [place].  Date and type of stent unknown.  It is known that patient did undergo the PCI after his CABG procedure in 2001.  Patient suffered an NSTEMI on 09/17/2019. Troponins were trended: 7 --> 474 --> 978 --> 1053 ng/L.    Most recent TTE was performed on 09/18/2019 revealing a normal left ventricular systolic function with an EF of 55 to 60%.  There were no regional wall motion abnormalities.  Right ventricle mildly enlarged with low normal systolic function.  There was trivial aortic and mitral valve regurgitation.  All transvalvular gradients were noted to be normal providing no evidence suggestive of valvular stenosis.  Aorta normal in size with no evidence of aneurysmal dilatation.  Diagnostic LEFT heart catheterization was performed on 09/18/2019 revealing multivessel CAD; 90% ostial proximal LCx, 100% mid LAD, 75% proximal RCA, and 100% mid LCx.  Previously placed stent to the mid RCA noted to be patent.  SVG-OM1 chronically occluded.  99% stenosis in the mid body of the SVG-D2 noted.  PCI was subsequently performed placing a 3.5 x 18 mm Resolute Onyx DES to the proximal RCA lesion and a 2.5 x 15 mm Resolute Onyx DES to the SVG-D2 bypass graft.  Procedure yielded excellent angiographic  result and TIMI-3 flow.  Repeat diagnostic LEFT heart catheterization was performed on 07/27/2021 revealing multivessel CAD; 100% mid LAD, 90% ostial to proximal LCx, and 100% mid LCx.  There was a 50 to 75% stenosis of the LIMA-LAD bypass graft.  SVG-OM1 occluded, as was his SVG-D2 graft.  Of note SVG-D2 with heavy thrombus at the origin and proximal graft.  Again, previously placed RCA stents widely patent.  Patient with extensive right to left collateral formation, thus interventional cardiology made the decision to defer further intervention opting for medical management.  Blood pressure well controlled at 120/72 mmHg on currently prescribed nitrate (isosorbide mononitrate) and beta-blocker (metoprolol tartrate therapies.  Patient is on rosuvastatin for his HLD diagnosis and ASCVD prevention.  Patient has a prediabetes diagnosis; last HgbA1c was 6.2% when checked on 04/25/2022.  Patient does have an OSAH diagnosis, however he does not require the use of nocturnal PAP therapy. Patient is able to complete all of his  ADL/IADLs without cardiovascular limitation.  Per the [facility]AS[facility], patient is able to achieve at least 4 METS of physical activity without experiencing any significant degree of angina/anginal equivalent symptoms. No changes were made to his medication regimen.  Patient to follow-up with outpatient cardiology in 6 months or sooner if needed.  Inge Rise is scheduled for an elective TOTAL KNEE ARTHROPLASTY (Right: Knee) on 10/26/2022 with Dr. Reinaldo Berber, MD.  Given patient's past medical history significant for cardiovascular diagnoses, presurgical cardiac clearance was sought by the PAT team.  Attending surgeon also requested clearance from patient's primary care provider.  Clearances were obtained as follows.  Per internal medicine ([place]), "overall patient appears to be optimized for surgery. Patient should be able to proceed with knee surgery as he appears to be LOW risk for planned  surgery".  Per cardiology Juliann Pares, MD), "this patient is optimized for surgery and may proceed with the planned procedural course with a ACCEPTABLE risk of significant perioperative cardiovascular complications".  In review of his medication reconciliation, it is noted that patient is currently on prescribed daily antithrombotic therapy.  Per directives from patient's primary attending surgeon, patient to continue his daily low-dose ASA throughout his perioperative course.  Patient reports previous perioperative complications with anesthesia in the past. Patient has a PMH (+) for PONV. Symptoms and history of PONV will be discussed with patient by anesthesia team on the day of her procedure. Interventions will be ordered as deemed necessary based on patient's individual care needs as determined by anesthesiologist. In review his EMR, there are no records available for review pertaining to past procedural/anesthetic courses within the [facility] system.     10/13/2022   12:36 PM 10/13/2022   11:39 AM 07/29/2021   12:19 PM  Vitals with BMI  Height  5\' 7"    Weight  223 lbs   BMI  34.92   Systolic 149  118  Diastolic 78  66  Pulse 97  62    Providers/Specialists:   NOTE: Primary physician provider listed below. Patient may have been seen by APP or partner within same practice.   PROVIDER ROLE / SPECIALTY LAST Wilber Bihari, MD Orthopedics (Surgeon) 10/13/2022  Marisue Ivan, MD Primary Care Provider 10/20/2022  Rudean Hitt, MD Cardiology 10/20/2022   Allergies:  Patient has no known allergies.  Current Home Medications:   No current facility-administered medications for this encounter.    acetaminophen (TYLENOL) 500 MG tablet   aspirin EC 81 MG tablet   Cholecalciferol  50 MCG (2000 UT) CAPS   co-enzyme Q-10 30 MG capsule   diphenhydramine-acetaminophen (TYLENOL PM) 25-500 MG TABS tablet   isosorbide mononitrate (IMDUR) 60 MG 24 hr tablet   magnesium gluconate  (MAGONATE) 500 MG tablet   metoprolol tartrate (LOPRESSOR) 50 MG tablet   Multiple Vitamin (MULTIVITAMIN) tablet   ramipril (ALTACE) 10 MG capsule   rosuvastatin (CRESTOR) 40 MG tablet   History:   Past Medical History:  Diagnosis Date   Adult ADHD    Anginal pain (HCC)    Aortic atherosclerosis (HCC)    Coronary artery disease 2001   a.) s/p 5v CABG 2001 ([place]); b.) PCI mRCA ([place]); date/stent type unk; c.) s/p NSTEMI 09/17/2019 -> LHC/PCI 09/18/2019: 90% o-pLCx, 100% mLAD, 75% pRCA (3.5 x 18 mm Resolute Onyx DES), 100% mLCx, stent pat to mRCA, VG-OM1 chronically occ, 99% mid body SVG-D2 (2.5 x 15 mm Resolute Onyx DES); d.) LHC 07/27/2021: 90% o-pLCx, 100% mLCx, 50-75% LIMA-LAD, 100% VG-OM1, 100% SVG-D2 (heavy thrombus)   COVID-19    Dyspnea    HLD (hyperlipidemia)    HOH (hard of hearing)    Hypertension    LAFB (left anterior fascicular block)    Long term current use of aspirin    NSTEMI (non-ST elevated myocardial infarction) (HCC) 09/17/2019   a.) troponins trended: 7 --> 474 --> 978 --> 1053 ng/L; b.) LHC/PCI 09/18/2019: 99% pSVG-D2 (2.5 x 15 mm Resolute Onyx DES), 75% pRCA (3.5 x 18 mm Resolute Onyx DES)   Osteoarthritis    Osteoarthritis of right knee    Pneumonia    PONV (postoperative nausea and vomiting)    after CABG   Pre-diabetes    S/P CABG x 5 2001   a.) performed while living in [place] in 2001; b.) LIMA-LAD, SVG-D2, LRA-OM1-OM2-OM3   Sleep apnea    Past Surgical History:  Procedure Laterality Date   ARTHROSCOPIC REPAIR ACL Right    x 2   COLONOSCOPY W/ POLYPECTOMY     CORONARY ANGIOPLASTY WITH STENT PLACEMENT Left    date unk; performed in [place]   CORONARY ARTERY BYPASS GRAFT N/A 2001   CORONARY STENT INTERVENTION N/A 09/18/2019   Procedure: CORONARY STENT INTERVENTION;  Surgeon: Alwyn Pea, MD;  Location: [facility] INVASIVE CV LAB;  Service: Cardiovascular;  Laterality: N/A;   LEFT HEART CATH AND CORONARY ANGIOGRAPHY N/A 07/27/2021    Procedure: LEFT HEART CATH AND CORONARY ANGIOGRAPHY;  Surgeon: Alwyn Pea, MD;  Location: [facility] INVASIVE CV LAB;  Service: Cardiovascular;  Laterality: N/A;   LEFT HEART CATH AND CORS/GRAFTS ANGIOGRAPHY N/A 09/18/2019   Procedure: LEFT HEART CATH AND CORS/GRAFTS ANGIOGRAPHY;  Surgeon: Dalia Heading, MD;  Location: [facility] INVASIVE CV LAB;  Service: Cardiovascular;  Laterality: N/A;   Family History  Problem Relation Age of Onset   Atrial fibrillation Mother    Hypertension Mother    CAD Father    Social History   Tobacco Use   Smoking status: Never   Smokeless tobacco: Never  Vaping Use   Vaping status: Never Used  Substance Use Topics   Alcohol use: Not Currently   Drug use: No    Pertinent Clinical Results:  LABS:   No visits with results within 3 Day(s) from this visit.  Latest known visit with results is:  Hospital Outpatient Visit on 10/13/2022  Component Date Value Ref Range Status   MRSA, PCR 10/13/2022 NEGATIVE  NEGATIVE Final   Staphylococcus aureus 10/13/2022 NEGATIVE  NEGATIVE Final   Comment: (NOTE)  The Xpert SA Assay (FDA approved for NASAL specimens in patients 83 years of age and older), is one component of a comprehensive surveillance program. It is not intended to diagnose infection nor to guide or monitor treatment. Performed at [facility], [address] [place], [place] [address]    WBC 10/13/2022 9.4  4.0 - 10.5 K/uL Final   RBC 10/13/2022 4.74  4.22 - 5.81 MIL/uL Final   Hemoglobin 10/13/2022 14.7  13.0 - 17.0 g/dL Final   HCT 82/95/6213 41.3  39.0 - 52.0 % Final   MCV 10/13/2022 87.1  80.0 - 100.0 fL Final   MCH 10/13/2022 31.0  26.0 - 34.0 pg Final   MCHC 10/13/2022 35.6  30.0 - 36.0 g/dL Final   RDW 08/65/7846 12.4  11.5 - 15.5 % Final   Platelets 10/13/2022 267  150 - 400 K/uL Final   nRBC 10/13/2022 0.0  0.0 - 0.2 % Final   Neutrophils Relative % 10/13/2022 66  % Final   Neutro Abs 10/13/2022 6.2  1.7 - 7.7 K/uL Final    Lymphocytes Relative 10/13/2022 24  % Final   Lymphs Abs 10/13/2022 2.3  0.7 - 4.0 K/uL Final   Monocytes Relative 10/13/2022 8  % Final   Monocytes Absolute 10/13/2022 0.7  0.1 - 1.0 K/uL Final   Eosinophils Relative 10/13/2022 2  % Final   Eosinophils Absolute 10/13/2022 0.2  0.0 - 0.5 K/uL Final   Basophils Relative 10/13/2022 0  % Final   Basophils Absolute 10/13/2022 0.0  0.0 - 0.1 K/uL Final   Immature Granulocytes 10/13/2022 0  % Final   Abs Immature Granulocytes 10/13/2022 0.04  0.00 - 0.07 K/uL Final   Performed at [facility], [address] [place], [place] [address]   Sodium 10/13/2022 135  135 - 145 mmol/L Final   Potassium 10/13/2022 4.1  3.5 - 5.1 mmol/L Final   Chloride 10/13/2022 101  98 - 111 mmol/L Final   CO2 10/13/2022 23  22 - 32 mmol/L Final   Glucose, Bld 10/13/2022 111 (H)  70 - 99 mg/dL Final   Glucose reference range applies only to samples taken after fasting for at least 8 hours.   BUN 10/13/2022 13  8 - 23 mg/dL Final   Creatinine, Ser 10/13/2022 0.90  0.61 - 1.24 mg/dL Final   Calcium 28/41/3244 9.8  8.9 - 10.3 mg/dL Final   Total Protein 02/15/7251 8.4 (H)  6.5 - 8.1 g/dL Final   Albumin 66/44/0347 4.8  3.5 - 5.0 g/dL Final   AST 42/59/5638 21  15 - 41 U/L Final   ALT 10/13/2022 43  0 - 44 U/L Final   Alkaline Phosphatase 10/13/2022 28 (L)  38 - 126 U/L Final   Total Bilirubin 10/13/2022 1.0  0.3 - 1.2 mg/dL Final   GFR, Estimated 10/13/2022 >60  >60 mL/min Final   Comment: (NOTE) Calculated using the CKD-EPI Creatinine Equation (2021)    Anion gap 10/13/2022 11  5 - 15 Final   Performed at [facility], [address] [place], [place] [address]   Color, Urine 10/13/2022 YELLOW (A)  YELLOW Final   APPearance 10/13/2022 CLEAR (A)  CLEAR Final   Specific Gravity, Urine 10/13/2022 1.013  1.005 - 1.030 Final   pH 10/13/2022 5.0  5.0 - 8.0 Final   Glucose, UA 10/13/2022 NEGATIVE  NEGATIVE mg/dL Final   Hgb urine dipstick  10/13/2022 NEGATIVE  NEGATIVE Final   Bilirubin Urine 10/13/2022 NEGATIVE  NEGATIVE  Final   Ketones, ur 10/13/2022 20 (A)  NEGATIVE mg/dL Final   Protein, ur 16/11/9602 NEGATIVE  NEGATIVE mg/dL Final   Nitrite 54/10/8117 NEGATIVE  NEGATIVE Final   Leukocytes,Ua 10/13/2022 NEGATIVE  NEGATIVE Final   Performed at [facility], [address] [place], [place] [address]   ABO/RH(D) 10/13/2022 O POS   Final   Antibody Screen 10/13/2022 NEG   Final   Sample Expiration 10/13/2022 10/27/2022,2359   Final   Extend sample reason 10/13/2022    Final                   Value:NO TRANSFUSIONS OR PREGNANCY IN THE PAST 3 MONTHS Performed at [facility], [address] [place], [place] [address]     ECG: Date: 10/13/2022 Time ECG obtained: 1137 AM Rate: 85 bpm Rhythm:  Normal sinus rhythm; LAFB Axis (leads I and aVF): Normal Intervals: PR 152 ms. QRS 92 ms. QTc 447 ms. ST segment and T wave changes: No evidence of acute ST segment elevation or depression.  Comparison: Similar to previous tracing obtained on 07/28/2021   IMAGING / PROCEDURES: DIAGNOSTIC RADIOGRAPHS OF RIGHT KNEE 4+ VIEWS performed on 10/13/2022 Show moderate/severe degenerative changes with medial and patellofemoral joint space narrowing with patellofemoral bone-on-bone articulation, osteophyte formation, subchondral cysts and sclerosis.  Kellgren-Lawrence grade 3/4.   The patient is status post ACL reconstruction with metallic screws in the proximal tibia and the distal femur.  AP, sunrise, and flexed PA of the left knee also show mild medial joint space narrowing with some proximal tibial sclerosis and patellofemoral degeneration as well.   No fractures or dislocations noted in either knee.Marland Kitchen   LEFT HEART CATHETERIZATION AND CORONARY ANGIOGRAPHY performed on 07/27/2021 Mildly reduced left ventricular systolic function with an EF of 45-50% LVEDP mildly elevated at 20 mmHg Multivessel CAD 100% mid LAD 90%  ostial to proximal LCx  100% mid LCx 100% proximal to mid LCx Bypass grafts 50-75% distally in the LIMA-LAD CTO of the SVG to LCx (D2) CTO of the SVG-D2 with heavy thrombus at the origin and proximal graft Recommendations Continued medical therapy  CT ANGIO CHEST PULMONARY EMBOLISM (PE) W OR WO CONTRAST performed on 07/27/2021 No evidence of pulmonary embolism or other active cardiopulmonary disease Aortic atherosclerosis  TRANSTHORACIC ECHOCARDIOGRAM performed on 09/18/2019 Left ventricular ejection fraction, by estimation, is 55 to 60%. The left ventricle has normal function. The left ventricle has no regional  wall motion abnormalities. Left ventricular diastolic parameters were  normal.  Right ventricular systolic function is low normal. The right ventricular size is mildly enlarged.  The mitral valve is grossly normal. Trivial mitral valve regurgitation.  The aortic valve is normal in structure. Aortic valve regurgitation is trivial.   Impression and Plan:  Curtis Cooke has been referred for pre-anesthesia review and clearance prior to him undergoing the planned anesthetic and procedural courses. Available labs, pertinent testing, and imaging results were personally reviewed by me in preparation for upcoming operative/procedural course. [facility] medical record has been updated following extensive record review and patient interview with PAT staff.   This patient has been appropriately cleared by cardiology (ACCEPTABLE) and by internal medicine (LOW) with the individually indicated risk of significant perioperative complications. Based on clinical review performed today (10/24/22), barring any significant acute changes in the patient's overall condition, it is anticipated that he will be able to proceed with the planned surgical intervention. Any acute changes in clinical condition may necessitate his procedure being postponed and/or  cancelled. Patient will meet with anesthesia team (MD  and/or CRNA) on the day of his procedure for preoperative evaluation/assessment. Questions regarding anesthetic course will be fielded at that time.   Pre-surgical instructions were reviewed with the patient during his PAT appointment, and questions were fielded to satisfaction by PAT clinical staff. He has been instructed on which medications that he will need to hold prior to surgery, as well as the ones that have been deemed safe/appropriate to take on the day of his procedure. As part of the general education provided by PAT, patient made aware both verbally and in writing, that he would need to abstain from the use of any illegal substances during his perioperative course.  He was advised that failure to follow the provided instructions could necessitate case cancellation or result in serious perioperative complications up to and including death. Patient encouraged to contact PAT and/or his surgeon's office to discuss any questions or concerns that may arise prior to surgery; verbalized understanding.   Quentin Mulling, MSN, APRN, FNP-C, CEN [facility]  Perioperative Services Nurse Practitioner Phone: [phone] Fax: [phone] 10/24/22 2:47 PM  NOTE: This note has been prepared using Dragon dictation software. Despite my best ability to proofread, there is always the potential that unintentional transcriptional errors may still occur from this process.

## 2022-10-24 ENCOUNTER — Encounter: Payer: Self-pay | Admitting: Orthopedic Surgery

## 2022-10-25 MED ORDER — CEFAZOLIN SODIUM-DEXTROSE 2-4 GM/100ML-% IV SOLN
2.0000 g | INTRAVENOUS | Status: AC
  Administered 2022-10-26: 2 g via INTRAVENOUS

## 2022-10-25 MED ORDER — FAMOTIDINE 20 MG PO TABS
20.0000 mg | ORAL_TABLET | Freq: Once | ORAL | Status: AC
  Administered 2022-10-26: 20 mg via ORAL

## 2022-10-25 MED ORDER — ORAL CARE MOUTH RINSE: 15.0000 mL | Freq: Once | OROMUCOSAL | Status: AC

## 2022-10-25 MED ORDER — CHLORHEXIDINE GLUCONATE 0.12 % MT SOLN
15.0000 mL | Freq: Once | OROMUCOSAL | Status: AC
  Administered 2022-10-26: 15 mL via OROMUCOSAL

## 2022-10-25 MED ORDER — DEXAMETHASONE SODIUM PHOSPHATE 10 MG/ML IJ SOLN
8.0000 mg | Freq: Once | INTRAMUSCULAR | Status: AC
  Administered 2022-10-26: 10 mg via INTRAVENOUS

## 2022-10-25 MED ORDER — TRANEXAMIC ACID-NACL 1000-0.7 MG/100ML-% IV SOLN
1000.0000 mg | INTRAVENOUS | Status: AC
  Administered 2022-10-26 (×2): 1000 mg via INTRAVENOUS

## 2022-10-25 MED ORDER — LACTATED RINGERS IV SOLN: INTRAVENOUS | Status: DC

## 2022-10-26 ENCOUNTER — Encounter: Payer: Self-pay | Admitting: Orthopedic Surgery

## 2022-10-26 ENCOUNTER — Other Ambulatory Visit: Payer: Self-pay

## 2022-10-26 ENCOUNTER — Ambulatory Visit: Payer: 59 | Admitting: Urgent Care

## 2022-10-26 ENCOUNTER — Observation Stay: Payer: 59

## 2022-10-26 ENCOUNTER — Encounter
Admission: RE | Disposition: A | Discharge status: Home Health | Payer: Self-pay | Source: Ambulatory Visit | Attending: Orthopedic Surgery

## 2022-10-26 ENCOUNTER — Observation Stay
Admission: RE | Admit: 2022-10-26 | Discharge: 2022-10-27 | Disposition: A | Discharge status: Home Health | Payer: 59 | Source: Ambulatory Visit | Attending: Orthopedic Surgery | Admitting: Orthopedic Surgery

## 2022-10-26 DIAGNOSIS — Z951 Presence of aortocoronary bypass graft: Secondary | ICD-10-CM | POA: Diagnosis not present

## 2022-10-26 DIAGNOSIS — I2510 Atherosclerotic heart disease of native coronary artery without angina pectoris: Secondary | ICD-10-CM | POA: Insufficient documentation

## 2022-10-26 DIAGNOSIS — Z955 Presence of coronary angioplasty implant and graft: Secondary | ICD-10-CM | POA: Insufficient documentation

## 2022-10-26 DIAGNOSIS — Z8616 Personal history of COVID-19: Secondary | ICD-10-CM | POA: Insufficient documentation

## 2022-10-26 DIAGNOSIS — I10 Essential (primary) hypertension: Secondary | ICD-10-CM | POA: Insufficient documentation

## 2022-10-26 DIAGNOSIS — M1711 Unilateral primary osteoarthritis, right knee: Secondary | ICD-10-CM | POA: Diagnosis present

## 2022-10-26 DIAGNOSIS — Z79899 Other long term (current) drug therapy: Secondary | ICD-10-CM | POA: Diagnosis not present

## 2022-10-26 DIAGNOSIS — Z96651 Presence of right artificial knee joint: Principal | ICD-10-CM

## 2022-10-26 DIAGNOSIS — Z7982 Long term (current) use of aspirin: Secondary | ICD-10-CM | POA: Diagnosis not present

## 2022-10-26 HISTORY — DX: Unspecified hearing loss, unspecified ear: H91.90

## 2022-10-26 HISTORY — DX: Atherosclerosis of aorta: I70.0

## 2022-10-26 HISTORY — DX: Unspecified osteoarthritis, unspecified site: M19.90

## 2022-10-26 HISTORY — PX: TOTAL KNEE ARTHROPLASTY: SHX125

## 2022-10-26 HISTORY — DX: Left anterior fascicular block: I44.4

## 2022-10-26 HISTORY — DX: Long term (current) use of aspirin: Z79.82

## 2022-10-26 HISTORY — DX: Attention-deficit hyperactivity disorder, unspecified type: F90.9

## 2022-10-26 LAB — ABO/RH: ABO/RH(D): O POS

## 2022-10-26 SURGERY — ARTHROPLASTY, KNEE, TOTAL
Anesthesia: Spinal | Site: Knee | Laterality: Right

## 2022-10-26 MED ORDER — HYDROCODONE-ACETAMINOPHEN 5-325 MG PO TABS
ORAL_TABLET | ORAL | Status: AC
  Filled 2022-10-26: qty 2

## 2022-10-26 MED ORDER — MORPHINE SULFATE (PF) 4 MG/ML IV SOLN: 0.5000 mg | INTRAVENOUS | Status: DC | PRN

## 2022-10-26 MED ORDER — METOCLOPRAMIDE HCL 10 MG PO TABS: 5.0000 mg | ORAL_TABLET | Freq: Three times a day (TID) | ORAL | Status: DC | PRN

## 2022-10-26 MED ORDER — MAGNESIUM GLUCONATE 500 MG PO TABS
500.0000 mg | ORAL_TABLET | Freq: Every day | ORAL | Status: DC
  Administered 2022-10-26 – 2022-10-27 (×2): 500 mg via ORAL
  Filled 2022-10-26 (×2): qty 1

## 2022-10-26 MED ORDER — ISOSORBIDE MONONITRATE ER 30 MG PO TB24
60.0000 mg | ORAL_TABLET | Freq: Every day | ORAL | Status: DC
  Administered 2022-10-26: 60 mg via ORAL
  Filled 2022-10-26: qty 2

## 2022-10-26 MED ORDER — ROSUVASTATIN CALCIUM 20 MG PO TABS
ORAL_TABLET | ORAL | Status: AC
  Filled 2022-10-26: qty 2

## 2022-10-26 MED ORDER — ACETAMINOPHEN 500 MG PO TABS
1000.0000 mg | ORAL_TABLET | Freq: Three times a day (TID) | ORAL | Status: DC
  Administered 2022-10-26 – 2022-10-27 (×2): 500 mg via ORAL

## 2022-10-26 MED ORDER — OXYCODONE HCL 5 MG/5ML PO SOLN: 5.0000 mg | Freq: Once | ORAL | Status: DC | PRN

## 2022-10-26 MED ORDER — ACETAMINOPHEN 10 MG/ML IV SOLN: 1000.0000 mg | Freq: Once | INTRAVENOUS | Status: DC | PRN

## 2022-10-26 MED ORDER — ONDANSETRON HCL 4 MG/2ML IJ SOLN: 4.0000 mg | Freq: Four times a day (QID) | INTRAMUSCULAR | Status: DC | PRN

## 2022-10-26 MED ORDER — FENTANYL CITRATE (PF) 100 MCG/2ML IJ SOLN
INTRAMUSCULAR | Status: DC | PRN
  Administered 2022-10-26 (×2): 25 ug via INTRAVENOUS
  Administered 2022-10-26: 50 ug via INTRAVENOUS

## 2022-10-26 MED ORDER — MIDAZOLAM HCL 5 MG/5ML IJ SOLN
INTRAMUSCULAR | Status: DC | PRN
  Administered 2022-10-26: 2 mg via INTRAVENOUS

## 2022-10-26 MED ORDER — SODIUM CHLORIDE 0.9 % IV SOLN: INTRAVENOUS | Status: DC

## 2022-10-26 MED ORDER — SODIUM CHLORIDE 0.9 % IV SOLN: INTRAVENOUS | Status: DC | PRN

## 2022-10-26 MED ORDER — KETOROLAC TROMETHAMINE 15 MG/ML IJ SOLN
INTRAMUSCULAR | Status: AC
  Filled 2022-10-26: qty 1

## 2022-10-26 MED ORDER — ASPIRIN 81 MG PO CHEW
81.0000 mg | CHEWABLE_TABLET | Freq: Every day | ORAL | Status: DC
  Administered 2022-10-27: 81 mg via ORAL

## 2022-10-26 MED ORDER — DOCUSATE SODIUM 100 MG PO CAPS
100.0000 mg | ORAL_CAPSULE | Freq: Two times a day (BID) | ORAL | Status: DC
  Administered 2022-10-26 – 2022-10-27 (×3): 100 mg via ORAL

## 2022-10-26 MED ORDER — PROPOFOL 1000 MG/100ML IV EMUL
INTRAVENOUS | Status: AC
  Filled 2022-10-26: qty 100

## 2022-10-26 MED ORDER — CEFAZOLIN SODIUM-DEXTROSE 2-4 GM/100ML-% IV SOLN
INTRAVENOUS | Status: AC
  Filled 2022-10-26: qty 100

## 2022-10-26 MED ORDER — SODIUM CHLORIDE FLUSH 0.9 % IV SOLN
INTRAVENOUS | Status: AC
  Filled 2022-10-26: qty 40

## 2022-10-26 MED ORDER — SODIUM CHLORIDE (PF) 0.9 % IJ SOLN
INTRAMUSCULAR | Status: DC | PRN
  Administered 2022-10-26: 71 mL via INTRAMUSCULAR

## 2022-10-26 MED ORDER — ONDANSETRON HCL 4 MG/2ML IJ SOLN
INTRAMUSCULAR | Status: DC | PRN
  Administered 2022-10-26: 4 mg via INTRAVENOUS

## 2022-10-26 MED ORDER — ISOSORBIDE MONONITRATE ER 30 MG PO TB24
ORAL_TABLET | ORAL | Status: AC
  Filled 2022-10-26: qty 2

## 2022-10-26 MED ORDER — TRAMADOL HCL 50 MG PO TABS: 50.0000 mg | ORAL_TABLET | Freq: Four times a day (QID) | ORAL | Status: DC | PRN

## 2022-10-26 MED ORDER — PHENOL 1.4 % MT LIQD: 1.0000 | OROMUCOSAL | Status: DC | PRN

## 2022-10-26 MED ORDER — BUPIVACAINE-EPINEPHRINE (PF) 0.25% -1:200000 IJ SOLN
INTRAMUSCULAR | Status: AC
  Filled 2022-10-26: qty 60

## 2022-10-26 MED ORDER — OXYCODONE HCL 5 MG PO TABS: 5.0000 mg | ORAL_TABLET | Freq: Once | ORAL | Status: DC | PRN

## 2022-10-26 MED ORDER — PROMETHAZINE HCL 25 MG/ML IJ SOLN: 6.2500 mg | INTRAMUSCULAR | Status: DC | PRN

## 2022-10-26 MED ORDER — METOCLOPRAMIDE HCL 5 MG/ML IJ SOLN: 5.0000 mg | Freq: Three times a day (TID) | INTRAMUSCULAR | Status: DC | PRN

## 2022-10-26 MED ORDER — LIDOCAINE HCL URETHRAL/MUCOSAL 2 % EX GEL
CUTANEOUS | Status: DC | PRN
Start: 2022-10-26 — End: 2022-10-26
  Administered 2022-10-26: 1 via TOPICAL

## 2022-10-26 MED ORDER — TRANEXAMIC ACID-NACL 1000-0.7 MG/100ML-% IV SOLN
INTRAVENOUS | Status: AC
  Filled 2022-10-26: qty 100

## 2022-10-26 MED ORDER — BUPIVACAINE HCL (PF) 0.5 % IJ SOLN
INTRAMUSCULAR | Status: DC | PRN
  Administered 2022-10-26: 3 mL

## 2022-10-26 MED ORDER — DOCUSATE SODIUM 100 MG PO CAPS
ORAL_CAPSULE | ORAL | Status: AC
  Filled 2022-10-26: qty 1

## 2022-10-26 MED ORDER — METOPROLOL TARTRATE 25 MG PO TABS
50.0000 mg | ORAL_TABLET | Freq: Two times a day (BID) | ORAL | Status: DC
  Administered 2022-10-26: 50 mg via ORAL

## 2022-10-26 MED ORDER — MENTHOL 3 MG MT LOZG: 1.0000 | LOZENGE | OROMUCOSAL | Status: DC | PRN

## 2022-10-26 MED ORDER — ROSUVASTATIN CALCIUM 20 MG PO TABS
40.0000 mg | ORAL_TABLET | Freq: Every day | ORAL | Status: DC
  Administered 2022-10-26: 40 mg via ORAL

## 2022-10-26 MED ORDER — BUPIVACAINE LIPOSOME 1.3 % IJ SUSP
INTRAMUSCULAR | Status: AC
  Filled 2022-10-26: qty 40

## 2022-10-26 MED ORDER — METOPROLOL TARTRATE 25 MG PO TABS
ORAL_TABLET | ORAL | Status: AC
  Filled 2022-10-26: qty 2

## 2022-10-26 MED ORDER — FENTANYL CITRATE (PF) 100 MCG/2ML IJ SOLN: 25.0000 ug | INTRAMUSCULAR | Status: DC | PRN

## 2022-10-26 MED ORDER — CHLORHEXIDINE GLUCONATE 0.12 % MT SOLN
OROMUCOSAL | Status: AC
  Filled 2022-10-26: qty 15

## 2022-10-26 MED ORDER — HYDROCODONE-ACETAMINOPHEN 5-325 MG PO TABS
ORAL_TABLET | ORAL | Status: AC
  Filled 2022-10-26: qty 1

## 2022-10-26 MED ORDER — DROPERIDOL 2.5 MG/ML IJ SOLN: 0.6250 mg | Freq: Once | INTRAMUSCULAR | Status: DC | PRN

## 2022-10-26 MED ORDER — SURGIPHOR WOUND IRRIGATION SYSTEM - OPTIME: TOPICAL | Status: DC | PRN

## 2022-10-26 MED ORDER — SODIUM CHLORIDE 0.9 % IR SOLN
Status: DC | PRN
  Administered 2022-10-26: 3000 mL

## 2022-10-26 MED ORDER — HYDROCODONE-ACETAMINOPHEN 5-325 MG PO TABS
1.0000 | ORAL_TABLET | ORAL | Status: DC | PRN
  Administered 2022-10-26 (×3): 1 via ORAL
  Administered 2022-10-27: 2 via ORAL

## 2022-10-26 MED ORDER — PROPOFOL 500 MG/50ML IV EMUL
INTRAVENOUS | Status: DC | PRN
  Administered 2022-10-26: 150 ug/kg/min via INTRAVENOUS

## 2022-10-26 MED ORDER — FENTANYL CITRATE (PF) 100 MCG/2ML IJ SOLN
INTRAMUSCULAR | Status: AC
  Filled 2022-10-26: qty 2

## 2022-10-26 MED ORDER — PANTOPRAZOLE SODIUM 40 MG PO TBEC
40.0000 mg | DELAYED_RELEASE_TABLET | Freq: Every day | ORAL | Status: DC
  Administered 2022-10-26 – 2022-10-27 (×2): 40 mg via ORAL

## 2022-10-26 MED ORDER — MIDAZOLAM HCL 2 MG/2ML IJ SOLN
INTRAMUSCULAR | Status: AC
  Filled 2022-10-26: qty 2

## 2022-10-26 MED ORDER — DEXMEDETOMIDINE HCL IN NACL 80 MCG/20ML IV SOLN
INTRAVENOUS | Status: DC | PRN
Start: 2022-10-26 — End: 2022-10-26
  Administered 2022-10-26: 8 ug via INTRAVENOUS

## 2022-10-26 MED ORDER — FAMOTIDINE 20 MG PO TABS
ORAL_TABLET | ORAL | Status: AC
  Filled 2022-10-26: qty 1

## 2022-10-26 MED ORDER — ACETAMINOPHEN 500 MG PO TABS
ORAL_TABLET | ORAL | Status: AC
  Filled 2022-10-26: qty 2

## 2022-10-26 MED ORDER — ENOXAPARIN SODIUM 30 MG/0.3ML IJ SOSY
30.0000 mg | PREFILLED_SYRINGE | Freq: Two times a day (BID) | INTRAMUSCULAR | Status: DC
  Administered 2022-10-27: 30 mg via SUBCUTANEOUS

## 2022-10-26 MED ORDER — RAMIPRIL 5 MG PO CAPS
10.0000 mg | ORAL_CAPSULE | Freq: Every day | ORAL | Status: DC
  Administered 2022-10-26: 10 mg via ORAL
  Filled 2022-10-26: qty 1
  Filled 2022-10-26 (×2): qty 2

## 2022-10-26 MED ORDER — PANTOPRAZOLE SODIUM 40 MG PO TBEC
DELAYED_RELEASE_TABLET | ORAL | Status: AC
  Filled 2022-10-26: qty 1

## 2022-10-26 MED ORDER — EPHEDRINE SULFATE (PRESSORS) 50 MG/ML IJ SOLN
INTRAMUSCULAR | Status: DC | PRN
Start: 2022-10-26 — End: 2022-10-26
  Administered 2022-10-26: 10 mg via INTRAVENOUS

## 2022-10-26 MED ORDER — ONDANSETRON HCL 4 MG PO TABS: 4.0000 mg | ORAL_TABLET | Freq: Four times a day (QID) | ORAL | Status: DC | PRN

## 2022-10-26 MED ORDER — KETOROLAC TROMETHAMINE 15 MG/ML IJ SOLN
7.5000 mg | Freq: Four times a day (QID) | INTRAMUSCULAR | Status: AC
  Administered 2022-10-26 – 2022-10-27 (×4): 7.5 mg via INTRAVENOUS

## 2022-10-26 MED ORDER — CEFAZOLIN SODIUM-DEXTROSE 2-4 GM/100ML-% IV SOLN
2.0000 g | Freq: Four times a day (QID) | INTRAVENOUS | Status: AC
  Administered 2022-10-26 (×2): 2 g via INTRAVENOUS

## 2022-10-26 SURGICAL SUPPLY — 77 items
ADH SKN CLS APL DERMABOND .7 (GAUZE/BANDAGES/DRESSINGS) ×1
APL PRP STRL LF DISP 70% ISPRP (MISCELLANEOUS) ×2
BLADE PATELLA W-PILOT HOLE 35 (BLADE) IMPLANT
BLADE SAGITTAL AGGR TOOTH XLG (BLADE) IMPLANT
BLADE SAW SAG 25X90X1.19 (BLADE) ×1 IMPLANT
BLADE SAW SAG 29X58X.64 (BLADE) ×1 IMPLANT
BOWL CEMENT MIX W/ADAPTER (MISCELLANEOUS) ×1 IMPLANT
CEMENT BONE R 1X40 (Cement) ×2 IMPLANT
CHLORAPREP W/TINT 26 (MISCELLANEOUS) ×2 IMPLANT
COMP FEM STD PS KNEE 7 RT (Joint) ×1 IMPLANT
COMP PATELLA PEG 3 32 (Joint) ×1 IMPLANT
COMPONENT FEM STD PS KNEE 7 RT (Joint) IMPLANT
COMPONENT PATELLA PEG 3 32 (Joint) IMPLANT
COOLER POLAR GLACIER W/PUMP (MISCELLANEOUS) ×1 IMPLANT
CUFF TOURN SGL QUICK 24 (TOURNIQUET CUFF)
CUFF TOURN SGL QUICK 30 (TOURNIQUET CUFF) ×1
CUFF TRNQT CYL 24X4X16.5-23 (TOURNIQUET CUFF) IMPLANT
CUFF TRNQT CYL 30X4X21-28X (TOURNIQUET CUFF) IMPLANT
DERMABOND ADVANCED .7 DNX12 (GAUZE/BANDAGES/DRESSINGS) ×1 IMPLANT
DRAPE INCISE IOBAN 66X60 STRL (DRAPES) IMPLANT
DRAPE SHEET LG 3/4 BI-LAMINATE (DRAPES) ×1 IMPLANT
DRSG MEPILEX SACRM 8.7X9.8 (GAUZE/BANDAGES/DRESSINGS) ×1 IMPLANT
DRSG OPSITE POSTOP 4X10 (GAUZE/BANDAGES/DRESSINGS) IMPLANT
DRSG OPSITE POSTOP 4X8 (GAUZE/BANDAGES/DRESSINGS) IMPLANT
ELECT REM PT RETURN 9FT ADLT (ELECTROSURGICAL) ×1
ELECTRODE REM PT RTRN 9FT ADLT (ELECTROSURGICAL) ×1 IMPLANT
GLOVE BIO SURGEON STRL SZ8 (GLOVE) ×1 IMPLANT
GLOVE BIOGEL PI IND STRL 8 (GLOVE) ×1 IMPLANT
GLOVE PI ORTHO PRO STRL 7.5 (GLOVE) ×2 IMPLANT
GLOVE PI ORTHO PRO STRL SZ8 (GLOVE) ×2 IMPLANT
GLOVE SURG SYN 7.5 E (GLOVE) ×1 IMPLANT
GLOVE SURG SYN 7.5 PF PI (GLOVE) ×1 IMPLANT
GOWN SRG XL LVL 3 NONREINFORCE (GOWNS) ×1 IMPLANT
GOWN STRL NON-REIN TWL XL LVL3 (GOWNS) ×1
GOWN STRL REUS W/ TWL LRG LVL3 (GOWN DISPOSABLE) ×1 IMPLANT
GOWN STRL REUS W/ TWL XL LVL3 (GOWN DISPOSABLE) ×1 IMPLANT
GOWN STRL REUS W/TWL LRG LVL3 (GOWN DISPOSABLE) ×1
GOWN STRL REUS W/TWL XL LVL3 (GOWN DISPOSABLE) ×1
HANDLE YANKAUER SUCT OPEN TIP (MISCELLANEOUS) ×1 IMPLANT
HOOD PEEL AWAY T7 (MISCELLANEOUS) ×2 IMPLANT
INSERT TIB ARTISURF SZ6-7 R 11 (Joint) IMPLANT
IV NS IRRIG 3000ML ARTHROMATIC (IV SOLUTION) ×1 IMPLANT
KIT TURNOVER KIT A (KITS) ×1 IMPLANT
MANIFOLD NEPTUNE II (INSTRUMENTS) ×1 IMPLANT
MARKER SKIN DUAL TIP RULER LAB (MISCELLANEOUS) ×1 IMPLANT
MAT ABSORB FLUID 56X50 GRAY (MISCELLANEOUS) ×1 IMPLANT
NDL FILTER BLUNT 18X1 1/2 (NEEDLE) ×1 IMPLANT
NDL HYPO 21X1.5 SAFETY (NEEDLE) ×1 IMPLANT
NDL SAFETY ECLIP 18X1.5 (MISCELLANEOUS) ×1 IMPLANT
NEEDLE FILTER BLUNT 18X1 1/2 (NEEDLE) IMPLANT
NEEDLE HYPO 21X1.5 SAFETY (NEEDLE) ×1 IMPLANT
PACK TOTAL KNEE (MISCELLANEOUS) ×1 IMPLANT
PAD ARMBOARD 7.5X6 YLW CONV (MISCELLANEOUS) ×3 IMPLANT
PAD WRAPON POLAR KNEE (MISCELLANEOUS) ×1 IMPLANT
PENCIL SMOKE EVACUATOR (MISCELLANEOUS) ×1 IMPLANT
PIN DRILL HDLS TROCAR 75 4PK (PIN) IMPLANT
PROS TIB KNEE PS 0D F RT (Joint) ×1 IMPLANT
PROSTHESIS TIB KNEE PS 0D F RT (Joint) IMPLANT
PULSAVAC PLUS IRRIG FAN TIP (DISPOSABLE) ×1
SCREW FEMALE HEX FIX 25X2.5 (ORTHOPEDIC DISPOSABLE SUPPLIES) IMPLANT
SCREW HEX HEADED 3.5X27 DISP (ORTHOPEDIC DISPOSABLE SUPPLIES) IMPLANT
SLEEVE SCD COMPRESS KNEE MED (STOCKING) ×1 IMPLANT
SOLUTION IRRIG SURGIPHOR (IV SOLUTION) ×1 IMPLANT
SUT DVC 2 QUILL PDO T11 36X36 (SUTURE) ×1 IMPLANT
SUT QUILL MONODERM 3-0 PS-2 (SUTURE) ×1 IMPLANT
SUT VIC AB 0 CT1 36 (SUTURE) ×1 IMPLANT
SUT VIC AB 2-0 CT2 27 (SUTURE) ×2 IMPLANT
SUT VICRYL 1-0 27IN ABS (SUTURE) ×1
SUTURE VICRYL 1-0 27IN ABS (SUTURE) ×1 IMPLANT
SYR 30ML LL (SYRINGE) ×2 IMPLANT
SYR TB 1ML LL NO SAFETY (SYRINGE) ×1 IMPLANT
TAPE CLOTH 3X10 WHT NS LF (GAUZE/BANDAGES/DRESSINGS) ×1 IMPLANT
TIP FAN IRRIG PULSAVAC PLUS (DISPOSABLE) ×1 IMPLANT
TOWEL OR 17X26 4PK STRL BLUE (TOWEL DISPOSABLE) IMPLANT
TRAP FLUID SMOKE EVACUATOR (MISCELLANEOUS) ×1 IMPLANT
WATER STERILE IRR 1000ML POUR (IV SOLUTION) ×1 IMPLANT
WRAPON POLAR PAD KNEE (MISCELLANEOUS) ×1

## 2022-10-26 NOTE — Transfer of Care (Signed)
Immediate Anesthesia Transfer of Care Note  Patient: Curtis Cooke  Procedure(s) Performed: TOTAL KNEE ARTHROPLASTY (Right: Knee)  Patient Location: PACU  Anesthesia Type:General  Level of Consciousness: drowsy and patient cooperative  Airway & Oxygen Therapy: Patient Spontanous Breathing and Patient connected to face mask oxygen  Post-op Assessment: Report given to RN and Post -op Vital signs reviewed and stable  Post vital signs: stable  Last Vitals:  Vitals Value Taken Time  BP 127/66 10/26/22 0933  Temp    Pulse 79 10/26/22 0936  Resp 20 10/26/22 0936  SpO2 97 % 10/26/22 0936  Vitals shown include unfiled device data.  Last Pain:  Vitals:   10/26/22 0619  TempSrc: Temporal  PainSc:          Complications: No notable events documented.

## 2022-10-26 NOTE — Evaluation (Signed)
Physical Therapy Evaluation Patient Details Name: Curtis Cooke MRN: [id number] DOB: 08-28-1960 Today's Date: 10/26/2022  History of Present Illness  Pt is a 62 y.o. male s/p R TKA secondary to OA 10/26/22.  PMH includes CAD (s/p CABG), NSTEMI, angina, aortic atherosclerosis, LAFB, HTN, HLD, prediabetes, dyspnea, sleep apnea (does not require nocturnal PAP therapy), OA, adult ADHD, PONV.  Clinical Impression  Prior to surgery, pt was independent with ambulation; lives with his wife on main level of home with steps to enter.  3/10 R knee pain at rest beginning of session; 5-6/10 R knee pain at rest end of session (nurse brought pt pain medication during session).  Currently pt is SBA with bed mobility and min assist with transfer using RW.  Pt able to take a couple steps in place with B UE support on RW but then pt's B knees buckled (pt maintained standing with assist) so deferred further activity and pt assisted back to bed.  Nurse updated on pt's status.  Pt would currently benefit from skilled PT to address noted impairments and functional limitations (see below for any additional details).  Upon hospital discharge, pt would benefit from ongoing therapy.     If plan is discharge home, recommend the following: A little help with walking and/or transfers;A little help with bathing/dressing/bathroom;Assistance with cooking/housework;Assist for transportation;Help with stairs or ramp for entrance   Can travel by private vehicle        Equipment Recommendations Rolling walker (2 wheels);BSC/3in1  Recommendations for Other Services       Functional Status Assessment Patient has had a recent decline in their functional status and demonstrates the ability to make significant improvements in function in a reasonable and predictable amount of time.     Precautions / Restrictions Precautions Precautions: Knee;Fall Precaution Booklet Issued: Yes (comment) Restrictions Weight Bearing Restrictions:  Yes RLE Weight Bearing: Weight bearing as tolerated      Mobility  Bed Mobility Overal bed mobility: Needs Assistance Bed Mobility: Supine to Sit, Sit to Supine     Supine to sit: Supervision, HOB elevated Sit to supine: Supervision, HOB elevated   General bed mobility comments: vc's for technique    Transfers Overall transfer level: Needs assistance Equipment used: Rolling walker (2 wheels) Transfers: Sit to/from Stand Sit to Stand: Min assist           General transfer comment: assist to initiate stand and control descent sitting; vc's for UE/LE placement/overall technique    Ambulation/Gait Ambulation/Gait assistance: Contact guard assist Gait Distance (Feet):  (pt able to take a couple steps in place but then pt's B knees buckled (pt able to maintain standing with assist); pt then assisted back to bed) Assistive device: Rolling walker (2 wheels)         General Gait Details: antalgic; decreased stance time R LE; vc's for walker use/technique  Stairs            Wheelchair Mobility     Tilt Bed    Modified Rankin (Stroke Patients Only)       Balance Overall balance assessment: Needs assistance Sitting-balance support: No upper extremity supported, Feet supported Sitting balance-Leahy Scale: Good Sitting balance - Comments: steady reaching within BOS   Standing balance support: Bilateral upper extremity supported Standing balance-Leahy Scale: Fair Standing balance comment: steady static standing with B UE support on RW  Pertinent Vitals/Pain Pain Assessment Pain Assessment: 0-10 Pain Score: 6  Pain Location: R knee Pain Descriptors / Indicators: Aching, Tender, Sore Pain Intervention(s): Limited activity within patient's tolerance, Monitored during session, Repositioned, Patient requesting pain meds-RN notified, Other (comment) (polar care applied) Vitals (HR and SpO2 on room air) stable and WFL  throughout treatment session.    Home Living Family/patient expects to be discharged to:: Private residence Living Arrangements: Spouse/significant other Available Help at Discharge: Family Type of Home: House Home Access: Stairs to enter   [employer] of Steps: 2 no railing from garage; 5 with L rail from front   Home Layout: Two level;Able to live on main level with bedroom/bathroom Home Equipment: Gilmer Mor - single point;Wheelchair - manual;Shower seat      Prior Function Prior Level of Function : Independent/Modified Independent             Mobility Comments: Independent with ambulation; no recent falls reported.       Extremity/Trunk Assessment   Upper Extremity Assessment Upper Extremity Assessment: Overall WFL for tasks assessed    Lower Extremity Assessment Lower Extremity Assessment: RLE deficits/detail RLE Deficits / Details: minimal assist for R LE SLR; at least 3/5 AROM hip flexion and ankle DF/PF RLE: Unable to fully assess due to pain    Cervical / Trunk Assessment Cervical / Trunk Assessment: Normal  Communication   Communication Communication: Hearing impairment Cueing Techniques: Verbal cues  Cognition Arousal: Alert Behavior During Therapy: WFL for tasks assessed/performed Overall Cognitive Status: Within Functional Limits for tasks assessed                                          General Comments General comments (skin integrity, edema, etc.): R knee dressing in place.  Nursing cleared pt for participation in physical therapy.  Pt agreeable to PT session.  [name] present beginning of session but left before pt got OOB.    Exercises Total Joint Exercises Ankle Circles/Pumps: AROM, Strengthening, Both, 10 reps, Supine Quad Sets: AROM, Strengthening, Both, 10 reps, Supine Heel Slides: AAROM, Strengthening, Right, 10 reps, Supine Hip ABduction/ADduction: AAROM, Strengthening, Right, 10 reps, Supine Straight Leg  Raises: AAROM, Strengthening, Right, 10 reps, Supine Goniometric ROM: R knee AROM 5 degrees short of neutral to 80 degrees flexion   Assessment/Plan    PT Assessment Patient needs continued PT services  PT Problem List Decreased strength;Decreased range of motion;Decreased activity tolerance;Decreased balance;Decreased mobility;Decreased knowledge of use of DME;Decreased knowledge of precautions;Decreased skin integrity;Pain       PT Treatment Interventions DME instruction;Gait training;Stair training;Functional mobility training;Therapeutic activities;Therapeutic exercise;Balance training;Patient/family education    PT Goals (Current goals can be found in the Care Plan section)  Acute Rehab PT Goals Patient Stated Goal: to improve pain and mobility PT Goal Formulation: With patient Time For Goal Achievement: 11/09/22 Potential to Achieve Goals: Good    Frequency BID     Co-evaluation               AM-PAC PT "6 Clicks" Mobility  Outcome Measure Help needed turning from your back to your side while in a flat bed without using bedrails?: None Help needed moving from lying on your back to sitting on the side of a flat bed without using bedrails?: A Little Help needed moving to and from a bed to a chair (including a wheelchair)?: A Little Help needed standing up  from a chair using your arms (e.g., wheelchair or bedside chair)?: A Little Help needed to walk in hospital room?: Total Help needed climbing 3-5 steps with a railing? : Total 6 Click Score: 15    End of Session Equipment Utilized During Treatment: Gait belt Activity Tolerance: Patient tolerated treatment well Patient left: in bed;with call bell/phone within reach;with bed alarm set;with nursing/sitter in room;with SCD's reapplied;Other (comment) (B heels floating via towel rolls) Nurse Communication: Mobility status;Precautions;Weight bearing status;Other (comment) (pt's pain status) PT Visit Diagnosis: Other  abnormalities of gait and mobility (R26.89);Muscle weakness (generalized) (M62.81);Pain Pain - Right/Left: Right Pain - part of body: Knee    Time: 1610-9604 PT Time Calculation (min) (ACUTE ONLY): 38 min   Charges:   PT Evaluation $PT Eval Low Complexity: 1 Low PT Treatments $Therapeutic Exercise: 8-22 mins PT General Charges $$ ACUTE PT VISIT: 1 Visit        Hendricks Limes, PT 10/26/22, 5:32 PM

## 2022-10-26 NOTE — Op Note (Signed)
Patient Name: Curtis Cooke  ONG:[id number]  Pre-Operative Diagnosis: Right knee Osteoarthritis  Post-Operative Diagnosis: (same)  Procedure: Right Total Knee Arthroplasty  Components/Implants: Femur: Persona Size 7 CR PPS   Tibia: Persona Size F Osseo Ti  Poly: 11mm MC  Patella: 32x3mm symmetric 3 peg Osseoti  Femoral Valgus Cut Angle: 5 degrees  Distal Femoral Re-cut: none  Patella Resurfacing: yes   Date of Surgery: 10/26/2022  Surgeon: Reinaldo Berber MD  Assistant: Amador Cunas PA (present and scrubbed throughout the case, critical for assistance with exposure, retraction, instrumentation, and closure)   Anesthesiologist: Adams  Anesthesia: Spinal   Tourniquet Time: 54 min  EBL: 25cc  IVF: 1000cc  Complications: None   Brief history: The patient is a 62 year old male with a history of osteoarthritis of the right knee with pain limiting their range of motion and activities of daily living, which has failed multiple attempts at conservative therapy.  The risks and benefits of total knee arthroplasty as definitive surgical treatment were discussed with the patient, who opted to proceed with the operation.  After outpatient medical clearance and optimization was completed the patient was admitted to [facility] for the procedure.  All preoperative films were reviewed and an appropriate surgical plan was made prior to surgery. Preoperative range of motion was 5 to 110 with a 5 degree flexion contracture. The patient was identified as having a varus alignment.   Description of procedure: The patient was brought to the operating room where laterality was confirmed by all those present to be the right side.   Spinal anesthesia was administered and the patient received an intravenous dose of antibiotics for surgical prophylaxis and a dose of tranexamic acid.  Patient is positioned supine on the operating room table with all bony prominences well-padded.  A  well-padded tourniquet was applied to the right thigh.  The knee was then prepped and draped in usual sterile fashion with multiple layers of adhesive and nonadhesive drapes.  All of those present in the operating room participated in a surgical timeout laterality and patient were confirmed.   An Esmarch was wrapped around the extremity and the leg was elevated and the knee flexed.  The tourniquet was inflated to a pressure of 275 mmHg. The Esmarch was removed and the leg was brought down to full extension.  The patella and tibial tubercle identified and outlined using a marking pen and a midline skin incision was made with a knife carried through the subcutaneous tissue down to the extensor retinaculum.  This incision was made in continuation with the prior scar along the midline distally.  After exposure of the extensor mechanism the medial parapatellar arthrotomy was performed with a scalpel and electrocautery extending down medial and distal to the tibial tubercle taking care to avoid incising the patellar tendon.   A standard medial release was performed over the proximal tibia.  The knee was brought into extension in order to excise the fat pad taking care not to damage the patella tendon.  The superior soft tissue was removed from the anterior surface of the distal femur to visualize for the procedure.  The knee was then brought into flexion with the patella subluxed laterally and subluxing the tibia anteriorly.  The ACL was transected and removed with electrocautery and additional soft tissue was removed from the proximal surface of the tibia to fully expose. The PCL was found to be intact and was preserved.  An extramedullary tibial cutting guide was then applied to the  leg with a spring-loaded ankle clamp placed around the distal tibia just above the malleoli the angulation of the guide was adjusted to give some posterior slope in the tibial resection with an appropriate varus/valgus alignment.  The  resection guide was then pinned to the proximal tibia and the proximal tibial surface was resected with an oscillating saw.  Careful attention was paid to ensure the blade did not disrupt any of the soft tissues including any lateral or medial ligament.  The prior ACL screw in the tibia was found to be distal and anterior to the area of the cut and the future tibial tray so decision was made to retain the screw as it was not symptomatic for the patient preoperatively.  Attention was then turned to the femur, with the knee slightly flexed a opening drill was used to enter the medullary canal of the femur.  After removing the drill marrow was suctioned out to decompress the distal femur.  An intramedullary femoral guide was then inserted into the drill hole and the alignment guide was seated firmly against the distal end of the medial femoral condyle.  The distal femoral cutting guide was then attached and pinned securely to the anterior surface of the femur and the intramedullary rod and alignment guide was removed.  Distal femur resection was then performed with an oscillating saw with retractors protecting medial and laterally.   The distal cutting block was then removed and the extension gap was checked with a spacer.  Extension gap was found to be appropriately sized to accommodate the spacer block.   The femoral sizing guide was then placed securely into the posterior condyles of the femur and the femoral size was measured and determined to be 7.  The size 7; 4-in-1 cutting guide was placed in position and secured with 2 pins.  The anterior posterior and chamfer resections were then performed with an oscillating saw.  Bony fragments and osteophytes were then removed.  Using a lamina spreader the posterior medial and lateral condyles were checked for additional osteophytes and posterior soft tissue remnants.  Any remaining meniscus was removed at this time.  Periarticular injection was performed in the  meniscal rims and posterior capsule with aspiration performed to ensure no intravascular injection.  The cut surfaces of the proximal tibia and the femur were then evaluated for the quality of the cuts and the quality of the bone and its density.  The decision was made that the bone quality was adequate and the cuts in appropriate alignment for press-fit components.  The tibia was then fully exposed and the tibial trial was pinned onto the plateau after confirming appropriate orientation and rotation.  Using the drill bushing the tibia was prepared to the appropriate drill depth.  Tibial broach impactor was then driven through the punch guide using a mallet.  The 4 spike holes were then carefully drilled.  The femoral trial component was then inserted onto the femur. A trial tibial polyethylene bearing was then placed and the knee was reduced.  The knee achieved full extension with no hyperextension and was found to be balanced in flexion and extension with the trials in place.  The knee was then brought into full extension the patella was everted and held with 2 Kocher clamps.  The articular surface of the patella was then resected with an patella reamer and saw after careful measurement with a caliper.  The patella was then prepared with the drill guide and a trial patella was placed.  The knee was then taken through range of motion and it was found that the patella articulated appropriately with the trochlea and good patellofemoral motion without subluxation.    The correct final components for implantation were confirmed and opened by the circulator nurse.  The knee was then irrigated and remove all bony fragments with care taken to preserve the bone marrow within the metaphysis of the proximal tibia and the distal femur for press-fit ingrowth purposes.  The tibia was then [facility] into place with concentric reduction with good fit and fill on the tibia.  The femoral component was also placed and impacted  with a strong press-fit.  A trial polyethylene tibial component was placed and the knee was brought into extension.  The patella button was then pressed into place utilizing the compression tool.  It had good fit and coverage of the patella.   At this time the periarticular injection cocktail was placed in the soft tissues surrounding the knee.  The balance of the knee was checked again and the final polyethylene size was confirmed. The tibial component was irrigated and locking mechanism checked to ensure it was clear of debris. The real polyethylene tibial component was implanted and the knee was brought through a range of motion.   The knee was then irrigated with copious amount of normal saline via pulsatile lavage to remove all loose bodies and other debris.  The knee was then irrigated with surgiphor betadine based wash and reirrigated with saline.  The tourniquet was then dropped and all bleeding vessels were identified and coagulated.  The arthrotomy was approximated with #1 Vicryl and closed with #2 Quill suture.  The knee was brought into slight flexion and the subcutaneous tissues were closed with 0 Vicryl, 2-0 Vicryl and a running subcuticular 3-0 monoderm quil suture.  Skin was then glued with Dermabond.  A sterile adhesive dressing was then placed along with a sequential compression device to the calf, a Ted stocking, and a cryotherapy cuff.   Sponge, needle, and Lap counts were all correct at the end of the case.   The patient was transferred off of the operating room table to a hospital bed, good pulses were found distally on the operative side.  The patient was transferred to the recovery room in stable condition.

## 2022-10-26 NOTE — H&P (Signed)
History of Present Illness: The patient is an 62 y.o. male seen in clinic today for follow-up eval ration of his right knee. The patient has had increasing severe right knee pain over the anterior medial aspect of the knee over the last year and a half. The patient has a history of ACL reconstruction and describes a constant limping and severe pain with walking which limits his ability to walk for long periods. He is unable to walk and exercise due to limitations of function due to his right knee pain. He has undergone steroid injections and gel injections with minimal improvements in pain or function of his knee. At this point he is interested in avoiding any future conservative treatments for a more permanent fix to his knee which functionally limits him. The patient denies fevers, chills, numbness, tingling, shortness of breath, chest pain, recent illness, or any trauma.  Patient is a non-smoker, nondiabetic with an A1c of 6.2 and has a BMI of 35.9  Past Medical History: Past Medical History:  Diagnosis Date  CAD (coronary artery disease) \  Hearing decreased, bilateral  Hyperlipidemia  Hypertension   Past Surgical History: Past Surgical History:  Procedure Laterality Date  right knee surgery 1979  and 1989, ACL  CORONARY ARTERY BYPASS GRAFT  x 5 and 1 stent placement   Past Family History: Family History  Problem Relation Age of Onset  Heart valve disease Mother  High blood pressure (Hypertension) Mother  Atrial fibrillation (Abnormal heart rhythm sometimes requiring treatment with blood thinners) Mother  Coronary Artery Disease (Blocked arteries around heart) Father   Medications: Current Outpatient Medications  Medication Sig Dispense Refill  acetaminophen (TYLENOL) 500 MG tablet Take 1,000 mg by mouth every 8 (eight) hours And tylenol PM at night  aspirin 81 MG EC tablet Take 81 mg by mouth once daily.  cholecalciferol (VITAMIN D3) 2,000 unit capsule Take 2,000 Units by mouth  once daily  coenzyme Q10 300 mg Cap Take 1 capsule by mouth once daily.  isosorbide mononitrate (IMDUR) 30 MG ER tablet Take 1 tablet (30 mg total) by mouth once daily 60 tablet 5  magnesium gluconate (MAGONATE) 27.5 mg magne- sium (500 mg) tablet Take 500 mg by mouth once daily  metoprolol tartrate (LOPRESSOR) 25 MG tablet Take 1 tablet (25 mg total) by mouth 2 (two) times daily 180 tablet 1  multivitamin tablet Take 1 tablet by mouth once daily  nitroGLYcerin (NITROSTAT) 0.4 MG SL tablet DISSOLVE 1 TAB UNDER TONGUE FOR CHEST PAIN - IF PAIN REMAINS AFTER 5 MIN, CALL 911 AND REPEAT DOSE. MAX 3 TABS IN 15 MINUTES 25 tablet 0  ramipriL (ALTACE) 10 MG capsule TAKE 1 CAPSULE BY MOUTH TWICE A DAY 180 capsule 1  rosuvastatin (CRESTOR) 20 MG tablet Take 1 tablet (20 mg total) by mouth once daily 30 tablet 3  zinc 50 mg Tab Take by mouth once daily   No current facility-administered medications for this visit.   Allergies: No Known Allergies   Visit Vitals: Vitals:  10/13/22 1004  BP: (!) 144/80    Review of Systems:  A comprehensive 14 point ROS was performed, reviewed, and the pertinent orthopaedic findings are documented in the HPI.  Physical Exam: General/Constitutional: No apparent distress: well-nourished and well developed. Eyes: Pupils equal, round with synchronous movement. Pulmonary exam: Lungs clear to auscultation bilaterally no wheezing rales or rhonchi Cardiac exam: Regular rate and rhythm no obvious murmurs rubs or gallops. Integumentary: No impressive skin lesions present, except as noted in  detailed exam. Neuro/Psych: Normal mood and affect, oriented to person, place and time.  Comprehensive Knee Exam: Gait Antalgic  Alignment Neutral   Inspection Right Left  Skin Normal appearance with no obvious deformity. No ecchymosis or erythema. Healed arthroscopic portals and distal midline incision Normal appearance with no obvious deformity. No ecchymosis or erythema.  Soft  Tissue No focal soft tissue swelling No focal soft tissue swelling  Quad Atrophy None None   Palpation  Right Left  Tenderness Medial joint line and peripatellar tenderness to palpation No peripatellar, patellar tendon, quad tendon, medial/lateral joint line pain  Crepitus + patellofemoral and tibiofemoral crepitus No patellofemoral or tibiofemoral crepitus  Effusion None None   Range of Motion Right Left  Flexion 5-110 Normal AROM  Extension Full knee extension without hyperextension Full knee extension without hyperextension   Ligamentous Exam Right Left  Lachman Normal Normal  Valgus 0 Normal Normal  Valgus 30 Normal Normal  Varus 0 Normal Normal  Varus 30 Normal Normal  Anterior Drawer Normal Normal  Posterior Drawer Normal Normal   Meniscal Exam Right Left  Hyperflexion Test I positive Negative  Hyperextension Test Negative Negative  McMurray's Positive Negative    Neurovascular Right Left  Quadriceps Strength 5/5 5/5  Hamstring Strength 5/5 5/5  Hip Abductor Strength 4/5 4/5  Distal Motor Normal Normal  Distal Sensory Normal light touch sensation Normal light touch sensation  Distal Pulses Normal Normal     Imaging Studies: I have reviewed AP, lateral,sunrise, and flexed PA weight bearing knee X-rays (4 views) of the right knee ordered and taken today in the office show moderate/severe degenerative changes with medial and patellofemoral joint space narrowing with patellofemoral bone-on-bone articulation, osteophyte formation, subchondral cysts and sclerosis. Kellgren-Lawrence grade 3/4. The patient is status post ACL reconstruction with metallic screws in the proximal tibia and the distal femur. AP, sunrise, and flexed PA of the left knee also show mild medial joint space narrowing with some proximal tibial sclerosis and patellofemoral degeneration as well. No fractures or dislocations noted in either knee..   Assessment:  ICD-10-CM  1. 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 Based upon the patient's continued symptoms and failure to respond to conservative treatment, I have recommended a right total knee replacement for this patient. A long discussion took place with the patient describing what a total joint replacement is and what the procedure would entail. A knee model, similar to the implants that will be used during the operation, was utilized to demonstrate the implants. Choices of implant manufactures were discussed and reviewed. The ability to secure the implant utilizing cement or cementless (press fit) fixation was discussed. The approach and exposure was discussed.   The hospitalization and post-operative care and rehabilitation were also discussed. The use of perioperative antibiotics and DVT prophylaxis were discussed. The risk, benefits and alternatives to a surgical intervention were discussed at length with the patient. The patient was also advised of risks related to the medical comorbidities and elevated body mass index (BMI). A lengthy discussion took place to review the most common complications including but not limited to: stiffness, loss of function, complex regional pain syndrome, deep vein thrombosis, pulmonary embolus, heart attack, stroke, infection, wound breakdown, numbness, intraoperative fracture, damage to nerves, tendon,muscles, arteries or other blood vessels, death and other possible complications from anesthesia. The patient was told that we will take steps to minimize these risks by  using sterile technique, antibiotics and DVT prophylaxis when appropriate and follow the patient postoperatively in the office setting to monitor progress. The possibility of recurrent pain, no improvement in pain and actual worsening of pain were also discussed with the patient. We discussed that we may remove or may not remove 1 or both of his  ACL reconstruction screws depending on if they are on the way and that the presence of the screws does slightly increase his risk of infection and bone injury during the surgery.  The discharge plan of care focused on the patient going home following surgery. The patient was encouraged to make the necessary arrangements to have someone stay with them when they are discharged home.   The benefits of surgery were discussed with the patient including the potential for improving the patient's current clinical condition through operative intervention. Alternatives to surgical intervention including continued conservative management were also discussed in detail. All questions were answered to the satisfaction of the patient. The patient participated and agreed to the plan of care as well as the use of the recommended implants for their total knee replacement surgery. An information packet was given to the patient to review prior to surgery.   The patient received clearance for surgery. Plan to continue plavix along with lovenox post operatively for DVT prophylaxis. Discussed risk and benefits of the dual therapy and the patient agrees with the prophylaxis plan. All questions answered patient agrees to the above plan to make preparations for a right total knee replacement.  Portions of this record have been created using Scientist, clinical (histocompatibility and immunogenetics). Dictation errors have been sought, but may not have been identified and corrected.  Reinaldo Berber MD

## 2022-10-26 NOTE — Interval H&P Note (Signed)
Patient history and physical updated. Consent reviewed including risks, benefits, and alternatives to surgery. Patient agrees with above plan to proceed with right total knee arthroplasty  

## 2022-10-26 NOTE — Anesthesia Preprocedure Evaluation (Addendum)
Anesthesia Evaluation  Patient identified by MRN, date of birth, ID band Patient awake    Reviewed: Allergy & Precautions, H&P , NPO status , Patient's Chart, lab work & pertinent test results, reviewed documented beta blocker date and time   History of Anesthesia Complications (+) PONV and history of anesthetic complications  Airway Mallampati: II   Neck ROM: full    Dental  (+) Poor Dentition   Pulmonary shortness of breath and with exertion, sleep apnea and Continuous Positive Airway Pressure Ventilation , pneumonia, resolved   Pulmonary exam normal        Cardiovascular Exercise Tolerance: Poor hypertension, On Medications (-) angina + CAD, + Past MI, + Cardiac Stents and + CABG  (-) Orthopnea and (-) PND Normal cardiovascular exam+ dysrhythmias  Rhythm:regular Rate:Normal     Neuro/Psych  PSYCHIATRIC DISORDERS      negative neurological ROS     GI/Hepatic negative GI ROS, Neg liver ROS,,,  Endo/Other  negative endocrine ROS    Renal/GU negative Renal ROS  negative genitourinary   Musculoskeletal   Abdominal   Peds  Hematology negative hematology ROS (+)   Anesthesia Other Findings Past Medical History: No date: Adult ADHD No date: Anginal pain (HCC) No date: Aortic atherosclerosis (HCC) 2001: Coronary artery disease     Comment:  a.) s/p 5v CABG 2001 ([place]); b.) PCI mRCA ([place]);               date/stent type unk; c.) s/p NSTEMI 09/17/2019 -> LHC/PCI              09/18/2019: 90% o-pLCx, 100% mLAD, 75% pRCA (3.5 x 18 mm               Resolute Onyx DES), 100% mLCx, stent pat to mRCA, VG-OM1               chronically occ, 99% mid body SVG-D2 (2.5 x 15 mm               Resolute Onyx DES); d.) LHC 07/27/2021: 90% o-pLCx, 100%               mLCx, 50-75% LIMA-LAD, 100% VG-OM1, 100% SVG-D2 (heavy               thrombus) No date: COVID-19 No date: Dyspnea No date: HLD (hyperlipidemia) No date: HOH (hard  of hearing) No date: Hypertension No date: LAFB (left anterior fascicular block) No date: Long term current use of aspirin 09/17/2019: NSTEMI (non-ST elevated myocardial infarction) (HCC)     Comment:  a.) troponins trended: 7 --> 474 --> 978 --> 1053 ng/L;               b.) LHC/PCI 09/18/2019: 99% pSVG-D2 (2.5 x 15 mm Resolute              Onyx DES), 75% pRCA (3.5 x 18 mm Resolute Onyx DES) No date: Osteoarthritis No date: Osteoarthritis of right knee No date: Pneumonia No date: PONV (postoperative nausea and vomiting)     Comment:  after CABG No date: Pre-diabetes 2001: S/P CABG x 5     Comment:  a.) performed while living in [place] in 2001; b.)               LIMA-LAD, SVG-D2, LRA-OM1-OM2-OM3 No date: Sleep apnea Past Surgical History: No date: ARTHROSCOPIC REPAIR ACL; Right     Comment:  x 2 No date: COLONOSCOPY W/ POLYPECTOMY No date: CORONARY ANGIOPLASTY WITH STENT PLACEMENT; Left  Comment:  date unk; performed in [place] 2001: CORONARY ARTERY BYPASS GRAFT; N/A 09/18/2019: CORONARY STENT INTERVENTION; N/A     Comment:  Procedure: CORONARY STENT INTERVENTION;  Surgeon:               Alwyn Pea, MD;  Location: [facility] INVASIVE CV LAB;               Service: Cardiovascular;  Laterality: N/A; 07/27/2021: LEFT HEART CATH AND CORONARY ANGIOGRAPHY; N/A     Comment:  Procedure: LEFT HEART CATH AND CORONARY ANGIOGRAPHY;                Surgeon: Alwyn Pea, MD;  Location: [facility] INVASIVE              CV LAB;  Service: Cardiovascular;  Laterality: N/A; 09/18/2019: LEFT HEART CATH AND CORS/GRAFTS ANGIOGRAPHY; N/A     Comment:  Procedure: LEFT HEART CATH AND CORS/GRAFTS ANGIOGRAPHY;               Surgeon: Dalia Heading, MD;  Location: [facility] INVASIVE CV              LAB;  Service: Cardiovascular;  Laterality: N/A;   Reproductive/Obstetrics negative OB ROS                             Anesthesia Physical Anesthesia Plan  ASA: 3  Anesthesia  Plan: Spinal   Post-op Pain Management:    Induction:   PONV Risk Score and Plan: 3  Airway Management Planned:   Additional Equipment:   Intra-op Plan:   Post-operative Plan:   Informed Consent: I have reviewed the patients History and Physical, chart, labs and discussed the procedure including the risks, benefits and alternatives for the proposed anesthesia with the patient or authorized representative who has indicated his/her understanding and acceptance.     Dental Advisory Given  Plan Discussed with: CRNA  Anesthesia Plan Comments:        Anesthesia Quick Evaluation

## 2022-10-26 NOTE — Progress Notes (Signed)
Pt c/o having bladder pressure. Bladder scan done, 707 ml. MD Aberman notified, orders received for in and out X1. 1000 ml drained.

## 2022-10-26 NOTE — Discharge Summary (Signed)
Physician Discharge Summary  Patient ID: Curtis Cooke MRN: [id number] DOB/AGE: 1960-08-25 62 y.o.  Admit date: 10/26/2022 Discharge date:10/27/2022  Admission Diagnoses:  S/P total knee arthroplasty, right [Z96.651]   Discharge Diagnoses: Patient Active Problem List   Diagnosis Date Noted   S/P total knee arthroplasty, right 10/26/2022   NSTEMI (non-ST elevated myocardial infarction) (HCC) 07/27/2021   Essential hypertension 07/27/2021   Hyperlipidemia 07/27/2021   CAD (coronary artery disease) of bypass graft 07/27/2021    Past Medical History:  Diagnosis Date   Adult ADHD    Anginal pain (HCC)    Aortic atherosclerosis (HCC)    Coronary artery disease 2001   a.) s/p 5v CABG 2001 ([place]); b.) PCI mRCA ([place]); date/stent type unk; c.) s/p NSTEMI 09/17/2019 -> LHC/PCI 09/18/2019: 90% o-pLCx, 100% mLAD, 75% pRCA (3.5 x 18 mm Resolute Onyx DES), 100% mLCx, stent pat to mRCA, VG-OM1 chronically occ, 99% mid body SVG-D2 (2.5 x 15 mm Resolute Onyx DES); d.) LHC 07/27/2021: 90% o-pLCx, 100% mLCx, 50-75% LIMA-LAD, 100% VG-OM1, 100% SVG-D2 (heavy thrombus)   COVID-19    Dyspnea    HLD (hyperlipidemia)    HOH (hard of hearing)    Hypertension    LAFB (left anterior fascicular block)    Long term current use of aspirin    NSTEMI (non-ST elevated myocardial infarction) (HCC) 09/17/2019   a.) troponins trended: 7 --> 474 --> 978 --> 1053 ng/L; b.) LHC/PCI 09/18/2019: 99% pSVG-D2 (2.5 x 15 mm Resolute Onyx DES), 75% pRCA (3.5 x 18 mm Resolute Onyx DES)   Osteoarthritis    Osteoarthritis of right knee    Pneumonia    PONV (postoperative nausea and vomiting)    after CABG   Pre-diabetes    S/P CABG x 5 2001   a.) performed while living in [place] in 2001; b.) LIMA-LAD, SVG-D2, LRA-OM1-OM2-OM3   Sleep apnea      Transfusion: None   Consultants (if any):   Discharged Condition: Improved  Hospital Course: Curtis Cooke is an 62 y.o. male who was admitted 10/26/2022 with a diagnosis  of S/P total knee arthroplasty, right and went to the operating room on 10/26/2022 and underwent the above named procedures.    Surgeries: Procedure(s): TOTAL KNEE ARTHROPLASTY on 10/26/2022 Patient tolerated the surgery well. Taken to PACU where she was stabilized and then transferred to the orthopedic floor.  Started on Lovenox 30 mg q 12 hrs. TEDs and SCDs applied bilaterally. Heels elevated on bed. No evidence of DVT. Negative Homan. Physical therapy started on day #1 for gait training and transfer. OT started day #1 for ADL and assisted devices.  Patient's IV was d/c on day #1. Patient was able to safely and independently complete all PT goals. PT recommending discharge to home.    On post op day #1 patient was stable and ready for discharge to home with home health PT.  Implants: Femur: Persona Size 7 CR PPS   Tibia: Persona Size F Osseo Ti  Poly: 11mm MC  Patella: 32x71mm symmetric 3 peg Osseoti   He was given perioperative antibiotics:  Anti-infectives (From admission, onward)    Start     Dose/Rate Route Frequency Ordered Stop   10/26/22 1400  ceFAZolin (ANCEF) IVPB 2g/100 mL premix        2 g 200 mL/hr over 30 Minutes Intravenous Every 6 hours 10/26/22 1343 10/26/22 2045   10/26/22 0600  ceFAZolin (ANCEF) IVPB 2g/100 mL premix        2 g 200  mL/hr over 30 Minutes Intravenous On call to O.R. 10/25/22 2148 10/26/22 0865     .  He was given sequential compression devices, early ambulation, and Lovenox, teds for DVT prophylaxis.  He benefited maximally from the hospital stay and there were no complications.    Recent vital signs:  Vitals:   10/27/22 0500 10/27/22 0734  BP: 138/86 113/63  Pulse: 88 (!) 55  Resp: 16 18  Temp: 98.2 F (36.8 C) 98 F (36.7 C)  SpO2: 98% 98%    Recent laboratory studies:  Lab Results  Component Value Date   HGB 10.9 (L) 10/27/2022   HGB 14.7 10/13/2022   HGB 14.3 07/28/2021   Lab Results  Component Value Date   WBC 15.3 (H)  10/27/2022   PLT 305 10/27/2022   Lab Results  Component Value Date   INR 0.9 09/17/2019   Lab Results  Component Value Date   NA 135 10/27/2022   K 4.3 10/27/2022   CL 101 10/27/2022   CO2 23 10/27/2022   BUN 18 10/27/2022   CREATININE 0.85 10/27/2022   GLUCOSE 121 (H) 10/27/2022    Discharge Medications:   Allergies as of 10/27/2022   No Known Allergies      Medication List     TAKE these medications    acetaminophen 500 MG tablet Commonly known as: TYLENOL Take 2 tablets (1,000 mg total) by mouth every 8 (eight) hours. What changed:  when to take this reasons to take this   aspirin EC 81 MG tablet Take 81 mg by mouth daily.   celecoxib 200 MG capsule Commonly known as: CeleBREX Take 1 capsule (200 mg total) by mouth 2 (two) times daily for 10 days.   Cholecalciferol 50 MCG (2000 UT) Caps Take 2,000 Units by mouth daily.   co-enzyme Q-10 30 MG capsule Take 300 mg by mouth daily.   diphenhydramine-acetaminophen 25-500 MG Tabs tablet Commonly known as: TYLENOL PM Take 1 tablet by mouth at bedtime.   docusate sodium 100 MG capsule Commonly known as: COLACE Take 1 capsule (100 mg total) by mouth 2 (two) times daily.   enoxaparin 40 MG/0.4ML injection Commonly known as: LOVENOX Inject 0.4 mLs (40 mg total) into the skin daily for 14 days.   isosorbide mononitrate 60 MG 24 hr tablet Commonly known as: IMDUR Take 1 tablet (60 mg total) by mouth daily.   magnesium gluconate 500 MG tablet Commonly known as: MAGONATE Take 500 mg by mouth daily.   metoprolol tartrate 50 MG tablet Commonly known as: LOPRESSOR Take 1 tablet (50 mg total) by mouth 2 (two) times daily.   multivitamin tablet Take 1 tablet by mouth daily.   ondansetron 4 MG tablet Commonly known as: ZOFRAN Take 1 tablet (4 mg total) by mouth every 6 (six) hours as needed for nausea.   oxyCODONE 5 MG immediate release tablet Commonly known as: Roxicodone Take 0.5-1 tablets (2.5-5 mg  total) by mouth every 8 (eight) hours as needed for breakthrough pain.   ramipril 10 MG capsule Commonly known as: ALTACE Take 1 capsule (10 mg total) by mouth daily.   rosuvastatin 40 MG tablet Commonly known as: CRESTOR Take 1 tablet (40 mg total) by mouth at bedtime.   traMADol 50 MG tablet Commonly known as: ULTRAM Take 1 tablet (50 mg total) by mouth every 6 (six) hours as needed for moderate pain.               Durable Medical Equipment  (From  admission, onward)           Start     Ordered   10/26/22 1716  For home use only DME Bedside commode  Once       Question:  Patient needs a bedside commode to treat with the following condition  Answer:  Impaired mobility   10/26/22 1715   10/26/22 1716  For home use only DME Walker rolling  Once       Question Answer Comment  Walker: With 5 Inch Wheels   Patient needs a walker to treat with the following condition Impaired mobility      10/26/22 1715            Diagnostic Studies: DG Knee Right Port  Result Date: 10/26/2022 CLINICAL DATA:  Status post total right knee arthroplasty. EXAM: PORTABLE RIGHT KNEE - 1-2 VIEW COMPARISON:  None Available. FINDINGS: Status post total right knee arthroplasty. Expected postoperative changes including small joint effusion and mild intra-articular air. No perihardware lucency is seen to indicate hardware failure or loosening. No acute fracture or dislocation. IMPRESSION: Status post total right knee arthroplasty without evidence of hardware failure. Electronically Signed   By: Neita Garnet M.D.   On: 10/26/2022 11:38    Disposition:      Follow-up Information     Evon Slack, PA-C Follow up in 2 week(s).   Specialties: Orthopedic Surgery, Emergency Medicine Contact information: [address] [place] [address] [phone]                  Signed: Patience Musca 10/27/2022, 8:12 AM

## 2022-10-26 NOTE — Anesthesia Procedure Notes (Signed)
Spinal  Patient location during procedure: OR Start time: 10/26/2022 7:33 AM End time: 10/26/2022 7:40 AM Reason for block: surgical anesthesia Staffing Performed: resident/CRNA  Anesthesiologist: Yevette Edwards, MD Resident/CRNA: Maryla Morrow., CRNA Performed by: Maryla Morrow., CRNA Authorized by: Yevette Edwards, MD   Preanesthetic Checklist Completed: patient identified, IV checked, site marked, risks and benefits discussed, surgical consent, monitors and equipment checked, pre-op evaluation and timeout performed Spinal Block Patient position: sitting Prep: Betadine Patient monitoring: heart rate, continuous pulse ox, blood pressure and cardiac monitor Approach: midline Location: L4-5 Injection technique: single-shot Needle Needle type: Whitacre and Introducer  Needle gauge: 24 G Needle length: 9 cm Assessment Events: CSF return Additional Notes Negative paresthesia. Negative blood return. Positive free-flowing CSF. Expiration date of kit checked and confirmed. Patient tolerated procedure well, without complications.

## 2022-10-26 NOTE — Progress Notes (Signed)
Patient is not able to walk the distance required to go the bathroom, or he/she is unable to safely negotiate stairs required to access the bathroom.  A 3in1 BSC will alleviate this problem  

## 2022-10-26 NOTE — Discharge Instructions (Signed)

## 2022-10-26 NOTE — Plan of Care (Signed)
  Problem: Pain Management: Goal: Pain level will decrease with appropriate interventions Outcome: Progressing   Problem: Skin Integrity: Goal: Will show signs of wound healing Outcome: Progressing   

## 2022-10-27 DIAGNOSIS — M1711 Unilateral primary osteoarthritis, right knee: Secondary | ICD-10-CM | POA: Diagnosis not present

## 2022-10-27 LAB — CBC
HCT: 31.6 % — ABNORMAL LOW (ref 39.0–52.0)
Hemoglobin: 10.9 g/dL — ABNORMAL LOW (ref 13.0–17.0)
MCH: 30.6 pg (ref 26.0–34.0)
MCHC: 34.5 g/dL (ref 30.0–36.0)
MCV: 88.8 fL (ref 80.0–100.0)
Platelets: 305 10*3/uL (ref 150–400)
RBC: 3.56 MIL/uL — ABNORMAL LOW (ref 4.22–5.81)
RDW: 12 % (ref 11.5–15.5)
WBC: 15.3 10*3/uL — ABNORMAL HIGH (ref 4.0–10.5)
nRBC: 0 % (ref 0.0–0.2)

## 2022-10-27 LAB — BASIC METABOLIC PANEL
Anion gap: 11 (ref 5–15)
BUN: 18 mg/dL (ref 8–23)
CO2: 23 mmol/L (ref 22–32)
Calcium: 8.4 mg/dL — ABNORMAL LOW (ref 8.9–10.3)
Chloride: 101 mmol/L (ref 98–111)
Creatinine, Ser: 0.85 mg/dL (ref 0.61–1.24)
GFR, Estimated: >60 mL/min (ref >60)
Glucose, Bld: 121 mg/dL — ABNORMAL HIGH (ref 70–99)
Potassium: 4.3 mmol/L (ref 3.5–5.1)
Sodium: 135 mmol/L (ref 135–145)

## 2022-10-27 MED ORDER — OXYCODONE HCL 5 MG PO TABS: 2.5000 mg | ORAL_TABLET | Freq: Three times a day (TID) | ORAL | 0 refills | Status: AC | PRN

## 2022-10-27 MED ORDER — ACETAMINOPHEN 500 MG PO TABS
ORAL_TABLET | ORAL | Status: AC
  Filled 2022-10-27: qty 1

## 2022-10-27 MED ORDER — PANTOPRAZOLE SODIUM 40 MG PO TBEC
DELAYED_RELEASE_TABLET | ORAL | Status: AC
  Filled 2022-10-27: qty 1

## 2022-10-27 MED ORDER — TRAMADOL HCL 50 MG PO TABS: 50.0000 mg | ORAL_TABLET | Freq: Four times a day (QID) | ORAL | 0 refills | Status: AC | PRN

## 2022-10-27 MED ORDER — ACETAMINOPHEN 500 MG PO TABS: 1000.0000 mg | ORAL_TABLET | Freq: Three times a day (TID) | ORAL | 0 refills | Status: AC

## 2022-10-27 MED ORDER — ONDANSETRON HCL 4 MG PO TABS: 4.0000 mg | ORAL_TABLET | Freq: Four times a day (QID) | ORAL | 0 refills | Status: AC | PRN

## 2022-10-27 MED ORDER — HYDROCODONE-ACETAMINOPHEN 5-325 MG PO TABS
ORAL_TABLET | ORAL | Status: AC
  Filled 2022-10-27: qty 2

## 2022-10-27 MED ORDER — ENOXAPARIN SODIUM 30 MG/0.3ML IJ SOSY
PREFILLED_SYRINGE | INTRAMUSCULAR | Status: AC
  Filled 2022-10-27: qty 0.3

## 2022-10-27 MED ORDER — CELECOXIB 200 MG PO CAPS
200.0000 mg | ORAL_CAPSULE | Freq: Two times a day (BID) | ORAL | 0 refills | Status: AC
Start: 2022-10-27 — End: 2022-11-06

## 2022-10-27 MED ORDER — KETOROLAC TROMETHAMINE 15 MG/ML IJ SOLN
INTRAMUSCULAR | Status: AC
  Filled 2022-10-27: qty 1

## 2022-10-27 MED ORDER — DOCUSATE SODIUM 100 MG PO CAPS
ORAL_CAPSULE | ORAL | Status: AC
  Filled 2022-10-27: qty 1

## 2022-10-27 MED ORDER — ASPIRIN 81 MG PO CHEW
CHEWABLE_TABLET | ORAL | Status: AC
  Filled 2022-10-27: qty 1

## 2022-10-27 MED ORDER — DOCUSATE SODIUM 100 MG PO CAPS: 100.0000 mg | ORAL_CAPSULE | Freq: Two times a day (BID) | ORAL | 0 refills | Status: AC

## 2022-10-27 MED ORDER — ENOXAPARIN SODIUM 40 MG/0.4ML IJ SOSY
40.0000 mg | PREFILLED_SYRINGE | INTRAMUSCULAR | 0 refills | Status: AC
Start: 2022-10-27 — End: 2022-11-10

## 2022-10-27 NOTE — Plan of Care (Signed)
Problem: Pain Management: Goal: Pain level will decrease with appropriate interventions Outcome: Progressing   Problem: Skin Integrity: Goal: Will show signs of wound healing Outcome: Progressing

## 2022-10-27 NOTE — Progress Notes (Signed)
Physical Therapy Treatment Patient Details Name: Curtis Cooke MRN: [id number] DOB: [birth year] Today's Date: 10/27/2022   History of Present Illness Pt is a 62 y.o. male s/p R TKA secondary to OA 10/26/22.  PMH includes CAD (s/p CABG), NSTEMI, angina, aortic atherosclerosis, LAFB, HTN, HLD, prediabetes, dyspnea, sleep apnea (does not require nocturnal PAP therapy), OA, adult ADHD, PONV.    PT Comments  Pt resting in bed upon PT arrival; agreeable to therapy; pt's wife present during session.  R knee pain 3/10 at rest beginning of session and 5/10 at rest end of session (pt reports recent pain medication).  Able to perform R LE SLR independently.  Pt demonstrated and verbalized appropriate understanding to TKR HEP (pt issued written handout already).  During session pt modified independent with bed mobility; CGA progressing to SBA with transfers; CGA progressing to SBA ambulating 160 feet with RW use; and CGA to navigate steps with UE support.  Pt educated on home safety, fall prevention, and safe car transfers: pt and pt's wife verbalizing appropriate understanding.  Pt appears safe to discharge home when medically appropriate--nurse updated.  Pt and pt's wife report no questions/concerns for home discharge.   If plan is discharge home, recommend the following: A little help with walking and/or transfers;A little help with bathing/dressing/bathroom;Assistance with cooking/housework;Assist for transportation;Help with stairs or ramp for entrance   Can travel by private vehicle      Yes  Equipment Recommendations  Rolling walker (2 wheels);BSC/3in1    Recommendations for Other Services       Precautions / Restrictions Precautions Precautions: Knee;Fall Precaution Booklet Issued: Yes (comment) Restrictions Weight Bearing Restrictions: Yes RLE Weight Bearing: Weight bearing as tolerated     Mobility  Bed Mobility Overal bed mobility: Modified Independent Bed Mobility: Supine to Sit      Supine to sit: Modified independent (Device/Increase time), HOB elevated     General bed mobility comments: mild increased effort to perform on own (pt planning on sleeping in recliner at home d/t bed being too high)    Transfers Overall transfer level: Needs assistance Equipment used: Rolling walker (2 wheels) Transfers: Sit to/from Stand, Bed to chair/wheelchair/BSC Sit to Stand: Contact guard assist, Supervision   Step pivot transfers: Contact guard assist (stand step turn bed to/from recliner with RW use)       General transfer comment: initial vc's for UE/LE placement/overall technique; steady with transfers (x2 from bed; x3 from recliner)    Ambulation/Gait Ambulation/Gait assistance: Contact guard assist, Supervision Gait Distance (Feet): 160 Feet Assistive device: Rolling walker (2 wheels) Gait Pattern/deviations: Step-through pattern, Decreased step length - right, Decreased step length - left Gait velocity: decreased     General Gait Details: antalgic; mild decreased stance time R LE; steady with RW use   Stairs Stairs: Yes Stairs assistance: Contact guard assist Stair Management: No rails, Step to pattern, Forwards, Backwards, One rail Left, With walker Number of Stairs: 12 General stair comments: Ascended/descended 4 steps with L railing forwards; ascended 8 steps with RW backwards and then descended 8 steps with RW forwards (pt's wife participating in assisting pt); initial vc's and demo for technique; step to pattern   Wheelchair Mobility     Tilt Bed    Modified Rankin (Stroke Patients Only)       Balance Overall balance assessment: Needs assistance Sitting-balance support: No upper extremity supported, Feet supported Sitting balance-Leahy Scale: Good Sitting balance - Comments: steady reaching within BOS   Standing balance support: Bilateral upper extremity  supported, During functional activity, Reliant on assistive device for balance Standing  balance-Leahy Scale: Good Standing balance comment: steady ambulating with RW use                            Cognition Arousal: Alert Behavior During Therapy: WFL for tasks assessed/performed Overall Cognitive Status: Within Functional Limits for tasks assessed                                          Exercises Total Joint Exercises Short Arc Quad: AROM, Strengthening, Right, 10 reps, Supine Heel Slides: AROM, Strengthening, Right, 10 reps, Supine Straight Leg Raises: AROM, Strengthening, Right, 10 reps, Supine Long Arc Quad: AROM, Strengthening, Right, 10 reps, Seated Knee Flexion: AROM, Strengthening, Right, 10 reps, Seated Goniometric ROM: R knee AROM 0-90 degrees    General Comments General comments (skin integrity, edema, etc.): R knee dressing in place.  Nursing cleared pt for participation in physical therapy.  Pt agreeable to PT session.      Pertinent Vitals/Pain Pain Assessment Pain Assessment: 0-10 Pain Score: 5  Pain Location: R knee Pain Descriptors / Indicators: Aching, Tender, Sore Pain Intervention(s): Limited activity within patient's tolerance, Monitored during session, Premedicated before session, Repositioned, Other (comment) (polar care applied) Vitals (HR and SpO2 on room air) stable and WFL throughout treatment session.    Home Living                          Prior Function            PT Goals (current goals can now be found in the care plan section) Acute Rehab PT Goals Patient Stated Goal: to improve pain and mobility PT Goal Formulation: With patient Time For Goal Achievement: 11/09/22 Potential to Achieve Goals: Good Progress towards PT goals: Progressing toward goals    Frequency    BID      PT Plan      Co-evaluation              AM-PAC PT "6 Clicks" Mobility   Outcome Measure  Help needed turning from your back to your side while in a flat bed without using bedrails?: None Help  needed moving from lying on your back to sitting on the side of a flat bed without using bedrails?: None Help needed moving to and from a bed to a chair (including a wheelchair)?: A Little Help needed standing up from a chair using your arms (e.g., wheelchair or bedside chair)?: A Little Help needed to walk in hospital room?: A Little Help needed climbing 3-5 steps with a railing? : A Little 6 Click Score: 20    End of Session Equipment Utilized During Treatment: Gait belt Activity Tolerance: Patient tolerated treatment well Patient left: in chair;with call bell/phone within reach;with family/visitor present;with SCD's reapplied;Other (comment) (R heel floating via towel roll) Nurse Communication: Mobility status;Precautions;Weight bearing status PT Visit Diagnosis: Other abnormalities of gait and mobility (R26.89);Muscle weakness (generalized) (M62.81);Pain Pain - Right/Left: Right Pain - part of body: Knee     Time: 0102-7253 PT Time Calculation (min) (ACUTE ONLY): 48 min  Charges:    $Gait Training: 8-22 mins $Therapeutic Exercise: 8-22 mins $Therapeutic Activity: 8-22 mins PT General Charges $$ ACUTE PT VISIT: 1 Visit  Hendricks Limes, PT 10/27/22, 11:10 AM

## 2022-10-27 NOTE — Progress Notes (Signed)
DISCHARGE NOTE:  Pt given discharge instructions and verbalized understanding. TED hose on both legs. BSC and walker sent with pt. Pt wheeled to car by staff, wife providing transportation.

## 2022-10-27 NOTE — TOC Progression Note (Signed)
Transition of Care [facility]) - Progression Note    Patient Details  Name: Curtis Cooke MRN: [id number] Date of Birth: 1960-08-10  Transition of Care [facility]) CM/SW Contact  Marlowe Sax, RN Phone Number: 10/27/2022, 8:27 AM  Clinical Narrative:     The patient is set up with [facility] thru [facility] by the surgeons office prior to surgery [employer] to deliver a RW and 3 in 1 to the bedside       Expected Discharge Plan and Services         Expected Discharge Date: 10/27/22                                     Social Determinants of Health (SDOH) Interventions SDOH Screenings   Food Insecurity: No Food Insecurity (10/26/2022)  Housing: Patient Declined (10/26/2022)  Transportation Needs: No Transportation Needs (10/26/2022)  Utilities: Not At Risk (10/26/2022)  Tobacco Use: Low Risk  (10/26/2022)    Readmission Risk Interventions     No data to display

## 2022-10-27 NOTE — Anesthesia Postprocedure Evaluation (Signed)
Anesthesia Post Note  Patient: Curtis Cooke  Procedure(s) Performed: TOTAL KNEE ARTHROPLASTY (Right: Knee)  Patient location during evaluation: PACU Anesthesia Type: Spinal Level of consciousness: awake and alert Pain management: pain level controlled Vital Signs Assessment: post-procedure vital signs reviewed and stable Respiratory status: spontaneous breathing, nonlabored ventilation, respiratory function stable and patient connected to nasal cannula oxygen Cardiovascular status: blood pressure returned to baseline and stable Postop Assessment: no apparent nausea or vomiting Anesthetic complications: no   There were no known notable events for this encounter.   Last Vitals:  Vitals:   10/26/22 2040 10/27/22 0500  BP: 138/89 138/86  Pulse: 94 88  Resp: 18 16  Temp: 36.9 C 36.8 C  SpO2: 96% 98%    Last Pain:  Vitals:   10/27/22 0620  TempSrc:   PainSc: 6                  Yevette Edwards

## 2022-10-27 NOTE — Progress Notes (Signed)
Subjective: 1 Day Post-Op Procedure(s) (LRB): TOTAL KNEE ARTHROPLASTY (Right) Patient reports pain as mild.   Patient is well, and has had no acute complaints or problems Denies any CP, SOB, ABD pain. We will continue therapy today.  Plan is to go Home after hospital stay.  Objective: Vital signs in last 24 hours: Temp:  [97 F (36.1 C)-98.5 F (36.9 C)] 98 F (36.7 C) (09/13 0734) Pulse Rate:  [53-94] 55 (09/13 0734) Resp:  [11-21] 18 (09/13 0734) BP: (113-139)/(62-89) 113/63 (09/13 0734) SpO2:  [96 %-100 %] 98 % (09/13 0734) Weight:  [101 kg] 101 kg (09/12 1101)  Intake/Output from previous day: 09/12 0701 - 09/13 0700 In: 2563.8 [P.O.:240; I.V.:2023.8; IV Piggyback:300] Out: 2175 [Urine:2175] Intake/Output this shift: No intake/output data recorded.  Recent Labs    10/27/22 0527  HGB 10.9*   Recent Labs    10/27/22 0527  WBC 15.3*  RBC 3.56*  HCT 31.6*  PLT 305   Recent Labs    10/27/22 0527  NA 135  K 4.3  CL 101  CO2 23  BUN 18  CREATININE 0.85  GLUCOSE 121*  CALCIUM 8.4*   No results for input(s): "LABPT", "INR" in the last 72 hours.  EXAM General - Patient is Alert, Appropriate, and Oriented Extremity - Neurovascular intact Sensation intact distally Intact pulses distally Dorsiflexion/Plantar flexion intact No cellulitis present Compartment soft Dressing - dressing C/D/I and no drainage Motor Function - intact, moving foot and toes well on exam.   Past Medical History:  Diagnosis Date   Adult ADHD    Anginal pain (HCC)    Aortic atherosclerosis (HCC)    Coronary artery disease 2001   a.) s/p 5v CABG 2001 ([place]); b.) PCI mRCA ([place]); date/stent type unk; c.) s/p NSTEMI 09/17/2019 -> LHC/PCI 09/18/2019: 90% o-pLCx, 100% mLAD, 75% pRCA (3.5 x 18 mm Resolute Onyx DES), 100% mLCx, stent pat to mRCA, VG-OM1 chronically occ, 99% mid body SVG-D2 (2.5 x 15 mm Resolute Onyx DES); d.) LHC 07/27/2021: 90% o-pLCx, 100% mLCx, 50-75% LIMA-LAD,  100% VG-OM1, 100% SVG-D2 (heavy thrombus)   COVID-19    Dyspnea    HLD (hyperlipidemia)    HOH (hard of hearing)    Hypertension    LAFB (left anterior fascicular block)    Long term current use of aspirin    NSTEMI (non-ST elevated myocardial infarction) (HCC) 09/17/2019   a.) troponins trended: 7 --> 474 --> 978 --> 1053 ng/L; b.) LHC/PCI 09/18/2019: 99% pSVG-D2 (2.5 x 15 mm Resolute Onyx DES), 75% pRCA (3.5 x 18 mm Resolute Onyx DES)   Osteoarthritis    Osteoarthritis of right knee    Pneumonia    PONV (postoperative nausea and vomiting)    after CABG   Pre-diabetes    S/P CABG x 5 2001   a.) performed while living in [place] in 2001; b.) LIMA-LAD, SVG-D2, LRA-OM1-OM2-OM3   Sleep apnea     Assessment/Plan:   1 Day Post-Op Procedure(s) (LRB): TOTAL KNEE ARTHROPLASTY (Right) Principal Problem:   S/P total knee arthroplasty, right  Estimated body mass index is 34.87 kg/m as calculated from the following:   Height as of this encounter: 5\' 7"  (1.702 m).   Weight as of this encounter: 101 kg. Advance diet Up with therapy Pain well-controlled Labs and vital signs are stable Care management to assist with discharge to home with home health PT pending safe completion of PT goals  DVT Prophylaxis - Lovenox, TED hose, and SCDs Weight-Bearing as tolerated  to right leg   T. Cranston Neighbor, PA-C [facility] 10/27/2022, 8:09 AM

## 2022-10-27 NOTE — Progress Notes (Signed)
Pts BP 107/66 HR 62, pt has several BP meds (Metoprolol, Imdur and Ramipril ) scheduled. Cranston Neighbor, PA notified, per Thayer Ohm hold BP meds. Pt notified to check BP at home, he verbalized understanding.

## 2023-07-11 ENCOUNTER — Encounter: Payer: Self-pay | Admitting: Internal Medicine

## 2023-07-11 ENCOUNTER — Encounter
Admission: RE | Disposition: A | Discharge status: Home / Self Care | Payer: Self-pay | Source: Home / Self Care | Attending: Internal Medicine

## 2023-07-11 ENCOUNTER — Ambulatory Visit
Admission: RE | Admit: 2023-07-11 | Discharge: 2023-07-11 | Disposition: A | Discharge status: Home / Self Care | Attending: Internal Medicine | Admitting: Internal Medicine

## 2023-07-11 ENCOUNTER — Other Ambulatory Visit: Payer: Self-pay

## 2023-07-11 DIAGNOSIS — Z01812 Encounter for preprocedural laboratory examination: Secondary | ICD-10-CM | POA: Diagnosis not present

## 2023-07-11 DIAGNOSIS — Z951 Presence of aortocoronary bypass graft: Secondary | ICD-10-CM | POA: Insufficient documentation

## 2023-07-11 DIAGNOSIS — I2582 Chronic total occlusion of coronary artery: Secondary | ICD-10-CM | POA: Diagnosis not present

## 2023-07-11 DIAGNOSIS — Z955 Presence of coronary angioplasty implant and graft: Secondary | ICD-10-CM | POA: Insufficient documentation

## 2023-07-11 DIAGNOSIS — I25700 Atherosclerosis of coronary artery bypass graft(s), unspecified, with unstable angina pectoris: Secondary | ICD-10-CM | POA: Diagnosis not present

## 2023-07-11 DIAGNOSIS — I200 Unstable angina: Secondary | ICD-10-CM

## 2023-07-11 HISTORY — PX: LEFT HEART CATH AND CORS/GRAFTS ANGIOGRAPHY: CATH118250

## 2023-07-11 SURGERY — LEFT HEART CATH AND CORS/GRAFTS ANGIOGRAPHY
Anesthesia: Moderate Sedation | Laterality: Left

## 2023-07-11 MED ORDER — SODIUM CHLORIDE 0.9 % WEIGHT BASED INFUSION
300.0000 mL/h | INTRAVENOUS | Status: AC
  Administered 2023-07-11: 300 mL/h via INTRAVENOUS

## 2023-07-11 MED ORDER — LIDOCAINE HCL 1 % IJ SOLN
INTRAMUSCULAR | Status: AC
  Filled 2023-07-11: qty 20

## 2023-07-11 MED ORDER — IOHEXOL 300 MG/ML  SOLN
INTRAMUSCULAR | Status: DC | PRN
  Administered 2023-07-11: 154 mL

## 2023-07-11 MED ORDER — MIDAZOLAM HCL 2 MG/2ML IJ SOLN
INTRAMUSCULAR | Status: DC | PRN
  Administered 2023-07-11 (×2): 1 mg via INTRAVENOUS

## 2023-07-11 MED ORDER — SODIUM CHLORIDE 0.9 % WEIGHT BASED INFUSION: 1.0000 mL/kg/h | INTRAVENOUS | Status: DC

## 2023-07-11 MED ORDER — FENTANYL CITRATE (PF) 100 MCG/2ML IJ SOLN
INTRAMUSCULAR | Status: AC
Start: 2023-07-11 — End: ?
  Filled 2023-07-11: qty 2

## 2023-07-11 MED ORDER — LIDOCAINE HCL (PF) 1 % IJ SOLN
INTRAMUSCULAR | Status: DC | PRN
  Administered 2023-07-11: 2 mL

## 2023-07-11 MED ORDER — SODIUM CHLORIDE 0.9% FLUSH
3.0000 mL | INTRAVENOUS | Status: DC | PRN
Start: 2023-07-12 — End: 2023-07-11

## 2023-07-11 MED ORDER — SODIUM CHLORIDE 0.9% FLUSH: 3.0000 mL | INTRAVENOUS | Status: DC | PRN

## 2023-07-11 MED ORDER — SODIUM CHLORIDE 0.9 % IV SOLN: 250.0000 mL | INTRAVENOUS | Status: DC | PRN

## 2023-07-11 MED ORDER — HEPARIN (PORCINE) IN NACL 1000-0.9 UT/500ML-% IV SOLN
INTRAVENOUS | Status: DC | PRN
  Administered 2023-07-11: 1000 mL

## 2023-07-11 MED ORDER — HEPARIN (PORCINE) IN NACL 1000-0.9 UT/500ML-% IV SOLN
INTRAVENOUS | Status: AC
  Filled 2023-07-11: qty 1000

## 2023-07-11 MED ORDER — FENTANYL CITRATE (PF) 100 MCG/2ML IJ SOLN
INTRAMUSCULAR | Status: DC | PRN
  Administered 2023-07-11 (×2): 50 ug via INTRAVENOUS

## 2023-07-11 MED ORDER — SODIUM CHLORIDE 0.9% FLUSH: 3.0000 mL | Freq: Two times a day (BID) | INTRAVENOUS | Status: DC

## 2023-07-11 MED ORDER — ACETAMINOPHEN 325 MG PO TABS
650.0000 mg | ORAL_TABLET | ORAL | Status: DC | PRN
  Administered 2023-07-11: 650 mg via ORAL
  Filled 2023-07-11: qty 2

## 2023-07-11 MED ORDER — MIDAZOLAM HCL 2 MG/2ML IJ SOLN
INTRAMUSCULAR | Status: AC
  Filled 2023-07-11: qty 2

## 2023-07-11 MED ORDER — ASPIRIN 81 MG PO CHEW
81.0000 mg | CHEWABLE_TABLET | ORAL | Status: AC
  Administered 2023-07-11: 81 mg via ORAL

## 2023-07-11 MED ORDER — ONDANSETRON HCL 4 MG/2ML IJ SOLN: 4.0000 mg | Freq: Four times a day (QID) | INTRAMUSCULAR | Status: DC | PRN

## 2023-07-11 MED ORDER — SODIUM CHLORIDE 0.9 % WEIGHT BASED INFUSION: 100.0000 mL/h | INTRAVENOUS | Status: DC

## 2023-07-11 MED ORDER — HEPARIN SODIUM (PORCINE) 1000 UNIT/ML IJ SOLN
INTRAMUSCULAR | Status: AC
  Filled 2023-07-11: qty 10

## 2023-07-11 MED ORDER — SODIUM CHLORIDE 0.9 % IV SOLN
250.0000 mL | INTRAVENOUS | Status: DC | PRN
Start: 2023-07-12 — End: 2023-07-11

## 2023-07-11 MED ORDER — HYDRALAZINE HCL 20 MG/ML IJ SOLN: 10.0000 mg | INTRAMUSCULAR | Status: DC | PRN

## 2023-07-11 MED ORDER — ASPIRIN 81 MG PO CHEW
CHEWABLE_TABLET | ORAL | Status: AC
  Filled 2023-07-11: qty 1

## 2023-07-11 MED ORDER — SODIUM CHLORIDE 0.9% FLUSH
3.0000 mL | Freq: Two times a day (BID) | INTRAVENOUS | Status: DC
Start: 2023-07-11 — End: 2023-07-11

## 2023-07-11 SURGICAL SUPPLY — 12 items
CATH INFINITI 5 FR LCB (CATHETERS) IMPLANT
CATH INFINITI 5FR MULTPACK ANG (CATHETERS) IMPLANT
CATH VISTA GUIDE 6FR LCB (CATHETERS) IMPLANT
DEVICE CLOSURE MYNXGRIP 5F (Vascular Products) IMPLANT
DRAPE BRACHIAL (DRAPES) IMPLANT
NDL PERC 18GX7CM (NEEDLE) IMPLANT
NEEDLE PERC 18GX7CM (NEEDLE) ×1 IMPLANT
PACK CARDIAC CATH (CUSTOM PROCEDURE TRAY) ×1 IMPLANT
SET ATX-X65L (MISCELLANEOUS) IMPLANT
SHEATH AVANTI 5FR X 11CM (SHEATH) IMPLANT
STATION PROTECTION PRESSURIZED (MISCELLANEOUS) IMPLANT
WIRE GUIDERIGHT .035X150 (WIRE) IMPLANT

## 2023-07-11 NOTE — Discharge Instructions (Signed)

## 2023-07-16 ENCOUNTER — Encounter: Payer: Self-pay | Admitting: Internal Medicine

## 2023-07-16 LAB — CARDIAC CATHETERIZATION: Cath EF Quantitative: 60 %

## 2023-07-17 ENCOUNTER — Other Ambulatory Visit: Payer: Self-pay

## 2023-07-17 DIAGNOSIS — I25708 Atherosclerosis of coronary artery bypass graft(s), unspecified, with other forms of angina pectoris: Secondary | ICD-10-CM

## 2023-07-18 ENCOUNTER — Other Ambulatory Visit: Payer: Self-pay | Admitting: Nurse Practitioner

## 2023-07-18 ENCOUNTER — Ambulatory Visit
Admission: RE | Admit: 2023-07-18 | Discharge: 2023-07-18 | Disposition: A | Discharge status: Home / Self Care | Source: Ambulatory Visit | Attending: Nurse Practitioner | Admitting: Nurse Practitioner

## 2023-07-18 DIAGNOSIS — R1909 Other intra-abdominal and pelvic swelling, mass and lump: Secondary | ICD-10-CM | POA: Diagnosis present

## 2023-07-18 DIAGNOSIS — R1031 Right lower quadrant pain: Secondary | ICD-10-CM | POA: Diagnosis present

## 2023-07-20 ENCOUNTER — Other Ambulatory Visit (INDEPENDENT_AMBULATORY_CARE_PROVIDER_SITE_OTHER): Payer: Self-pay | Admitting: Vascular Surgery

## 2023-07-20 ENCOUNTER — Encounter (INDEPENDENT_AMBULATORY_CARE_PROVIDER_SITE_OTHER): Payer: Self-pay

## 2023-07-20 DIAGNOSIS — I724 Aneurysm of artery of lower extremity: Secondary | ICD-10-CM

## 2023-07-23 ENCOUNTER — Ambulatory Visit (INDEPENDENT_AMBULATORY_CARE_PROVIDER_SITE_OTHER): Admitting: Vascular Surgery

## 2023-07-23 ENCOUNTER — Ambulatory Visit (INDEPENDENT_AMBULATORY_CARE_PROVIDER_SITE_OTHER)

## 2023-07-23 ENCOUNTER — Encounter (INDEPENDENT_AMBULATORY_CARE_PROVIDER_SITE_OTHER): Payer: Self-pay | Admitting: Vascular Surgery

## 2023-07-23 DIAGNOSIS — E782 Mixed hyperlipidemia: Secondary | ICD-10-CM | POA: Diagnosis not present

## 2023-07-23 DIAGNOSIS — I214 Non-ST elevation (NSTEMI) myocardial infarction: Secondary | ICD-10-CM | POA: Diagnosis not present

## 2023-07-23 DIAGNOSIS — I10 Essential (primary) hypertension: Secondary | ICD-10-CM | POA: Diagnosis not present

## 2023-07-23 DIAGNOSIS — I729 Aneurysm of unspecified site: Secondary | ICD-10-CM | POA: Insufficient documentation

## 2023-07-23 DIAGNOSIS — I724 Aneurysm of artery of lower extremity: Secondary | ICD-10-CM | POA: Diagnosis not present

## 2023-07-23 NOTE — Progress Notes (Signed)
 MRN : [id number]  Curtis Cooke is a 63 y.o. ([birth year]) male who presents with chief complaint of check circulation.  History of Present Illness:   I am asked to evaluate the patient by Dr. Beau Bound.  Patient is a 63 year old gentleman who underwent a left heart catheterization Jul 11, 2023.  Postoperatively he was noted to have a small hematoma.  An ultrasound was obtained which suggested possible pseudoaneurysm.  Over the weekend the patient has been relaxing and noted that the symptoms in his right groin have improved significantly.  Duplex ultrasound obtained today demonstrates a small hematoma.  There is no evidence for persistence of his pseudoaneurysm.  It appears to have thrombosed.  No outpatient medications have been marked as taking for the 07/23/23 encounter (Appointment) with Prescilla Brod, Ninette Basque, MD.    Past Medical History:  Diagnosis Date   Adult ADHD    Anginal pain (HCC)    Aortic atherosclerosis (HCC)    Coronary artery disease 2001   a.) s/p 5v CABG 2001 ([place] ); b.) PCI mRCA ([place] ); date/stent type unk; c.) s/p NSTEMI 09/17/2019 -> LHC/PCI 09/18/2019: 90% o-pLCx, 100% mLAD, 75% pRCA (3.5 x 18 mm Resolute Onyx DES), 100% mLCx, stent pat to mRCA, VG-OM1 chronically occ, 99% mid body SVG-D2 (2.5 x 15 mm Resolute Onyx DES); d.) LHC 07/27/2021: 90% o-pLCx, 100% mLCx, 50-75% LIMA-LAD, 100% VG-OM1, 100% SVG-D2 (heavy thrombus)   COVID-19    Dyspnea    HLD (hyperlipidemia)    HOH (hard of hearing)    Hypertension    LAFB (left anterior fascicular block)    Long term current use of aspirin     NSTEMI (non-ST elevated myocardial infarction) (HCC) 09/17/2019   a.) troponins trended: 7 --> 474 --> 978 --> 1053 ng/L; b.) LHC/PCI 09/18/2019: 99% pSVG-D2 (2.5 x 15 mm Resolute Onyx DES), 75% pRCA (3.5 x 18 mm Resolute Onyx DES)   Osteoarthritis    Osteoarthritis of right knee    Pneumonia    PONV  (postoperative nausea and vomiting)    after CABG   Pre-diabetes    S/P CABG x 5 2001   a.) performed while living in [place]  in 2001; b.) LIMA-LAD, SVG-D2, LRA-OM1-OM2-OM3   Sleep apnea     Past Surgical History:  Procedure Laterality Date   ARTHROSCOPIC REPAIR ACL Right    x 2   COLONOSCOPY W/ POLYPECTOMY     CORONARY ANGIOPLASTY WITH STENT PLACEMENT Left    date unk; performed in [place]    CORONARY ARTERY BYPASS GRAFT N/A 2001   CORONARY STENT INTERVENTION N/A 09/18/2019   Procedure: CORONARY STENT INTERVENTION;  Surgeon: Antonette Batters, MD;  Location: [facility] INVASIVE CV LAB;  Service: Cardiovascular;  Laterality: N/A;   LEFT HEART CATH AND CORONARY ANGIOGRAPHY N/A 07/27/2021   Procedure: LEFT HEART CATH AND CORONARY ANGIOGRAPHY;  Surgeon: Antonette Batters, MD;  Location: [facility] INVASIVE CV LAB;  Service: Cardiovascular;  Laterality: N/A;   LEFT HEART CATH AND CORS/GRAFTS ANGIOGRAPHY N/A 09/18/2019   Procedure: LEFT HEART CATH AND CORS/GRAFTS ANGIOGRAPHY;  Surgeon: Ronney Cola, MD;  Location: [facility] INVASIVE CV LAB;  Service: Cardiovascular;  Laterality: N/A;   LEFT HEART CATH AND CORS/GRAFTS ANGIOGRAPHY Left 07/11/2023   Procedure: LEFT HEART CATH AND CORS/GRAFTS ANGIOGRAPHY;  Surgeon: Antonette Batters, MD;  Location: [facility] INVAS[facility] CV LAB;  Service: Cardiovascular;  Laterality: Left;   TOTAL KNEE ARTHROPLASTY Right 10/26/2022   Procedure: TOTAL KNEE ARTHROPLASTY;  Surgeon: Venus Ginsberg, MD;  Location: [facility] ORS;  Service: Orthopedics;  Laterality: Right;    Social History Social History   Tobacco Use   Smoking status: Never   Smokeless tobacco: Never  Vaping Use   Vaping status: Never Used  Substance Use Topics   Alcohol use: Not Currently   Drug use: No    Family History Family History  Problem Relation Age of Onset   Atrial fibrillation Mother    Hypertension Mother    CAD Father     No Known Allergies   REVIEW OF SYSTEMS (Negative unless  checked)  Constitutional: [] Weight loss  [] Fever  [] Chills Cardiac: [] Chest pain   [] Chest pressure   [] Palpitations   [] Shortness of breath when laying flat   [] Shortness of breath with exertion. Vascular:  [x] Pain in legs with walking   [] Pain in legs at rest  [] History of DVT   [] Phlebitis   [] Swelling in legs   [] Varicose veins   [] Non-healing ulcers Pulmonary:   [] Uses home oxygen   [] Productive cough   [] Hemoptysis   [] Wheeze  [] COPD   [] Asthma Neurologic:  [] Dizziness   [] Seizures   [] History of stroke   [] History of TIA  [] Aphasia   [] Vissual changes   [] Weakness or numbness in arm   [] Weakness or numbness in leg Musculoskeletal:   [] Joint swelling   [] Joint pain   [] Low back pain Hematologic:  [] Easy bruising  [] Easy bleeding   [] Hypercoagulable state   [] Anemic Gastrointestinal:  [] Diarrhea   [] Vomiting  [] Gastroesophageal reflux/heartburn   [] Difficulty swallowing. Genitourinary:  [] Chronic kidney disease   [] Difficult urination  [] Frequent urination   [] Blood in urine Skin:  [] Rashes   [] Ulcers  Psychological:  [] History of anxiety   []  History of major depression.  Physical Examination  There were no vitals filed for this visit. There is no height or weight on file to calculate BMI. Gen: WD/WN, NAD Head: [place]/AT, No temporalis wasting.  Ear/Nose/Throat: Hearing grossly intact, nares w/o erythema or drainage Eyes: PER, EOMI, sclera nonicteric.  Neck: Supple, no masses.  No bruit or JVD.  Pulmonary:  Good air movement, no audible wheezing, no use of accessory muscles.  Cardiac: RRR, normal S1, S2, no Murmurs. Vascular:  mild trophic changes, no open wounds Vessel Right Left  Radial Palpable Palpable  Gastrointestinal: soft, non-distended. No guarding/no peritoneal signs.  Musculoskeletal: M/S 5/5 throughout.  No visible deformity.  Neurologic: CN 2-12 intact. Pain and light touch intact in extremities.  Symmetrical.  Speech is fluent. Motor exam as listed above. Psychiatric:  Judgment intact, Mood & affect appropriate for pt's clinical situation. Dermatologic: No rashes or ulcers noted.  No changes consistent with cellulitis.   CBC Lab Results  Component Value Date   WBC 15.3 (H) 10/27/2022   HGB 10.9 (L) 10/27/2022   HCT 31.6 (L) 10/27/2022   MCV 88.8 10/27/2022   PLT 305 10/27/2022    BMET    Component Value Date/Time   NA 135 10/27/2022 0527   K 4.3 10/27/2022 0527   CL 101 10/27/2022 0527   CO2 23 10/27/2022 0527   GLUCOSE 121 (H) 10/27/2022 0527   BUN 18 10/27/2022  1610   CREATININE 0.85 10/27/2022 0527   CALCIUM  8.4 (L) 10/27/2022 0527   GFRNONAA >60 10/27/2022 0527   GFRAA >60 09/19/2019 0522   CrCl cannot be calculated (Patient's most recent lab result is older than the maximum 21 days allowed.).  COAG Lab Results  Component Value Date   INR 0.9 09/17/2019    Radiology US  Lower Ext Art Right Ltd Result Date: 07/18/2023 CLINICAL DATA:  right inginal pain EXAM: RIGHT LOWER EXTREMITY ARTERIAL DUPLEX SCAN TECHNIQUE: Gray-scale sonography as well as color Doppler and duplex ultrasound was performed to evaluate the lower extremity arteries including the common, superficial and profunda femoral arteries, popliteal artery and calf arteries. COMPARISON:  None Available. FINDINGS: Short necked pseudoaneurysm anterior to the right common femoral artery. Pseudoaneurysm measures 3.4 cm diameter, overall hematoma 6.2 cm. Normal color signal in the common femoral artery. Normal venous waveform in the common femoral vein. 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 Electronically Signed   By: Nicoletta Barrier M.D.   On: 07/18/2023 14:01   CARDIAC CATHETERIZATION Result Date: 07/16/2023   Mid LAD lesion is 100% stenosed.   Ost Cx to Prox Cx lesion is 90% stenosed.   Mid Cx lesion is 100% stenosed.   Origin lesion is 100% stenosed.   Origin to Prox Graft lesion is 100% stenosed.   Dist LAD lesion is 75% stenosed.   Prox Cx to Mid  Cx lesion is 100% stenosed.   Non-stenotic Mid RCA lesion was previously treated.   Non-stenotic Prox RCA lesion was previously treated.   Non-stenotic Prox Graft lesion was previously treated.   The left ventricular systolic function is normal.   LV end diastolic pressure is normal.   The left ventricular ejection fraction is 55-65% by visual estimate.   There is no mitral valve regurgitation. Conclusion Left heart cath with grafts posssible PCI and stent Normal LVF EF=60% Coronaries [place]main large with minor irregular non obstructive dsease LAD large diffuse disease proximal 90% proximal LIMA to mid LAD widely patent diffuse disease mid to distal 75 to 90% Diagonal 1 occluded mid 0 flow Circumflex is large 90% ostial lesion 100% mid occlusion TIMI 0 flow RCA large widely patent to stents in the proximal and mid to distal RCA widely patent TIMI-3 flow Grafts LIMA to mid LAD widely patent SVG to circumflex system occluded at the origin SVG to diagonal totally occluded at the origin Intervention deferred not indicated Images are not significantly different from 08/02/2021 Recommend conservative medical therapy     Assessment/Plan 1. Pseudoaneurysm (HCC) (Primary) At the present time the patient's symptoms are improving.  Noninvasive studies do not suggest persistence of a pseudoaneurysm.  I will continue to observe and see him back in 3 weeks.  He can begin walking but I have told him to refrain from any strenuous activity for at least 2 more weeks.  2. NSTEMI (non-ST elevated myocardial infarction) (HCC) Continue cardiac and antihypertensive medications as already ordered and reviewed, no changes at this time.  Continue statin as ordered and reviewed, no changes at this time  Nitrates PRN for chest pain  3. Essential hypertension Continue antihypertensive medications as already ordered, these medications have been reviewed and there are no changes at this time.  4. Mixed hyperlipidemia Continue statin  as ordered and reviewed, no changes at this time    Devon Fogo, MD  07/23/2023 8:39 AM

## 2023-08-09 ENCOUNTER — Telehealth (HOSPITAL_COMMUNITY): Payer: Self-pay | Admitting: *Deleted

## 2023-08-09 NOTE — Telephone Encounter (Signed)
 Attempted to call patient to cancel his upcoming Cardiac CT appointment. Left message on voicemail with name and callback number  Chantal Requena RN Navigator Cardiac Imaging [facility] [phone] Office [phone] Cell

## 2023-08-12 DIAGNOSIS — T148XXA Other injury of unspecified body region, initial encounter: Secondary | ICD-10-CM | POA: Insufficient documentation

## 2023-08-12 NOTE — Progress Notes (Deleted)
 MRN : [id number]  Curtis Cooke is a 63 y.o. (October 27, 1960) male who presents with chief complaint of check circulation.  History of Present Illness:   I am asked to evaluate the patient by Dr. Florencio.   Patient is a 63 year old gentleman who underwent a left heart catheterization Jul 11, 2023.  Postoperatively he was noted to have a small hematoma.  An ultrasound was obtained which suggested possible pseudoaneurysm.  Over the weekend the patient has been relaxing and noted that the symptoms in his right groin have improved significantly.   Duplex ultrasound obtained today demonstrates a small hematoma.  There is no evidence for persistence of his pseudoaneurysm.  It appears to have thrombosed.  No outpatient medications have been marked as taking for the 08/13/23 encounter (Appointment) with Jama, Cordella MATSU, MD.    Past Medical History:  Diagnosis Date   Adult ADHD    Anginal pain (HCC)    Aortic atherosclerosis (HCC)    Coronary artery disease 2001   a.) s/p 5v CABG 2001 ([place] ); b.) PCI mRCA ([place] ); date/stent type unk; c.) s/p NSTEMI 09/17/2019 -> LHC/PCI 09/18/2019: 90% o-pLCx, 100% mLAD, 75% pRCA (3.5 x 18 mm Resolute Onyx DES), 100% mLCx, stent pat to mRCA, VG-OM1 chronically occ, 99% mid body SVG-D2 (2.5 x 15 mm Resolute Onyx DES); d.) LHC 07/27/2021: 90% o-pLCx, 100% mLCx, 50-75% LIMA-LAD, 100% VG-OM1, 100% SVG-D2 (heavy thrombus)   COVID-19    Dyspnea    HLD (hyperlipidemia)    HOH (hard of hearing)    Hypertension    LAFB (left anterior fascicular block)    Long term current use of aspirin     NSTEMI (non-ST elevated myocardial infarction) (HCC) 09/17/2019   a.) troponins trended: 7 --> 474 --> 978 --> 1053 ng/L; b.) LHC/PCI 09/18/2019: 99% pSVG-D2 (2.5 x 15 mm Resolute Onyx DES), 75% pRCA (3.5 x 18 mm Resolute Onyx DES)   Osteoarthritis    Osteoarthritis of right knee    Pneumonia    PONV  (postoperative nausea and vomiting)    after CABG   Pre-diabetes    S/P CABG x 5 2001   a.) performed while living in [place]  in 2001; b.) LIMA-LAD, SVG-D2, LRA-OM1-OM2-OM3   Sleep apnea     Past Surgical History:  Procedure Laterality Date   ARTHROSCOPIC REPAIR ACL Right    x 2   COLONOSCOPY W/ POLYPECTOMY     CORONARY ANGIOPLASTY WITH STENT PLACEMENT Left    date unk; performed in [place]    CORONARY ARTERY BYPASS GRAFT N/A 2001   CORONARY STENT INTERVENTION N/A 09/18/2019   Procedure: CORONARY STENT INTERVENTION;  Surgeon: Florencio Cara BIRCH, MD;  Location: [facility] INVASIVE CV [facility];  Service: Cardiovascular;  Laterality: N/A;   LEFT HEART CATH AND CORONARY ANGIOGRAPHY N/A 07/27/2021   Procedure: LEFT HEART CATH AND CORONARY ANGIOGRAPHY;  Surgeon: Florencio Cara BIRCH, MD;  Location: [facility];  Service: Cardiovascular;  Laterality: N/A;   LEFT HEART CATH AND CORS/GRAFTS ANGIOGRAPHY N/A 09/18/2019   Procedure: LEFT HEART CATH AND CORS/GRAFTS ANGIOGRAPHY;  Surgeon: Bosie Vinie LABOR, MD;  Location: [facility] INVAS[facility]  CV LAB;  Service: Cardiovascular;  Laterality: N/A;   LEFT HEART CATH AND CORS/GRAFTS ANGIOGRAPHY Left 07/11/2023   Procedure: LEFT HEART CATH AND CORS/GRAFTS ANGIOGRAPHY;  Surgeon: Florencio Cara BIRCH, MD;  Location: [facility] CV [facility];  Service: Cardiovascular;  Laterality: Left;   TOTAL KNEE ARTHROPLASTY Right 10/26/2022   Procedure: TOTAL KNEE ARTHROPLASTY;  Surgeon: Lorelle Hussar, MD;  Location: [facility];  Service: Orthopedics;  Laterality: Right;    Social History Social History   Tobacco Use   Smoking status: Never   Smokeless tobacco: Never  Vaping Use   Vaping status: Never Used  Substance Use Topics   Alcohol use: Not Currently   Drug use: No    Family History Family History  Problem Relation Age of Onset   Atrial fibrillation Mother    Hypertension Mother    CAD Father     No Known Allergies   REVIEW OF SYSTEMS (Negative unless  checked)  Constitutional: [] Weight loss  [] Fever  [] Chills Cardiac: [] Chest pain   [] Chest pressure   [] Palpitations   [] Shortness of breath when laying flat   [] Shortness of breath with exertion. Vascular:  [x] Pain in legs with walking   [] Pain in legs at rest  [] History of DVT   [] Phlebitis   [] Swelling in legs   [] Varicose veins   [] Non-healing ulcers Pulmonary:   [] Uses home oxygen   [] Productive cough   [] Hemoptysis   [] Wheeze  [] COPD   [] Asthma Neurologic:  [] Dizziness   [] Seizures   [] History of stroke   [] History of TIA  [] Aphasia   [] Vissual changes   [] Weakness or numbness in arm   [] Weakness or numbness in leg Musculoskeletal:   [] Joint swelling   [] Joint pain   [] Low back pain Hematologic:  [] Easy bruising  [] Easy bleeding   [] Hypercoagulable state   [] Anemic Gastrointestinal:  [] Diarrhea   [] Vomiting  [] Gastroesophageal reflux/heartburn   [] Difficulty swallowing. Genitourinary:  [] Chronic kidney disease   [] Difficult urination  [] Frequent urination   [] Blood in urine Skin:  [] Rashes   [] Ulcers  Psychological:  [] History of anxiety   []  History of major depression.  Physical Examination  There were no vitals filed for this visit. There is no height or weight on file to calculate BMI. Gen: WD/WN, NAD Head: [place]/AT, No temporalis wasting.  Ear/Nose/Throat: Hearing grossly intact, nares w/o erythema or drainage Eyes: PER, EOMI, sclera nonicteric.  Neck: Supple, no masses.  No bruit or JVD.  Pulmonary:  Good air movement, no audible wheezing, no use of accessory muscles.  Cardiac: RRR, normal S1, S2, no Murmurs. Vascular:  mild trophic changes, no open wounds Vessel Right Left  Radial Palpable Palpable  PT Not Palpable Not Palpable  DP Not Palpable Not Palpable  Gastrointestinal: soft, non-distended. No guarding/no peritoneal signs.  Musculoskeletal: M/S 5/5 throughout.  No visible deformity.  Neurologic: CN 2-12 intact. Pain and light touch intact in extremities.  Symmetrical.   Speech is fluent. Motor exam as listed above. Psychiatric: Judgment intact, Mood & affect appropriate for pt's clinical situation. Dermatologic: No rashes or ulcers noted.  No changes consistent with cellulitis.   CBC Lab Results  Component Value Date   WBC 15.3 (H) 10/27/2022   HGB 10.9 (L) 10/27/2022   HCT 31.6 (L) 10/27/2022   MCV 88.8 10/27/2022   PLT 305 10/27/2022    BMET    Component Value Date/Time   NA 135 10/27/2022 0527   K 4.3 10/27/2022 0527   CL 101 10/27/2022 0527   CO2  23 10/27/2022 0527   GLUCOSE 121 (H) 10/27/2022 0527   BUN 18 10/27/2022 0527   CREATININE 0.85 10/27/2022 0527   CALCIUM  8.4 (L) 10/27/2022 0527   GFRNONAA >60 10/27/2022 0527   GFRAA >60 09/19/2019 0522   CrCl cannot be calculated (Patient's most recent lab result is older than the maximum 21 days allowed.).  COAG Lab Results  Component Value Date   INR 0.9 09/17/2019    Radiology VAS US  GROIN PSEUDOANEURYSM Result Date: 07/23/2023  ARTERIAL PSEUDOANEURYSM  Patient Name:  MARCELLES CLINARD  Date of Exam:   07/23/2023 Medical Rec #: [id number]   Accession #:    [id number] Date of Birth: Nov 18, 1960   Patient Gender: M Patient Age:   18 years Exam Location:  [facility] Procedure:      VAS US  QUILLIAN CAPES Referring Phys: CORDELLA SHAWL --------------------------------------------------------------------------------  Exam: Right groin Indications: Patient complains of Right groin pseudoaneurysm. History: S/p catheterization. Performing Technologist: Elsie Churn RT, RDMS, RVT  Examination Guidelines: A complete evaluation includes B-mode imaging, spectral Doppler, color Doppler, and power Doppler as needed of all accessible portions of each vessel. Bilateral testing is considered an integral part of a complete examination. Limited examinations for reoccurring indications may be performed as noted. +------------+----------+---------+------+----------+ Right DuplexPSV (cm/s)Waveform  PlaqueComment(s) +------------+----------+---------+------+----------+ Ext.Iliac             triphasic                 +------------+----------+---------+------+----------+ CFA                   triphasic                 +------------+----------+---------+------+----------+ Prox SFA              triphasic                 +------------+----------+---------+------+----------+  Findings: A mostly anechoic, mixed echogenic structure measuring approximately 7.0 cm x 2.9 cm is visualized at the anterior to right CFA & SFA with ultrasound characteristics of a hematoma.  Summary: No evidence of pseudoaneurysm, AVF or DVT was appreciated on today's exam. Hematoma noted anterior to the right groin arterial vasculature, as descrobed above. Diagnosing physician: CORDELLA SHAWL MD Electronically signed by CORDELLA SHAWL MD on 07/23/2023 at 5:24:09 PM.    --------------------------------------------------------------------------------    Final    US  Lower Ext Art Right Ltd Result Date: 07/18/2023 CLINICAL DATA:  right inginal pain EXAM: RIGHT LOWER EXTREMITY ARTERIAL DUPLEX SCAN TECHNIQUE: Gray-scale sonography as well as color Doppler and duplex ultrasound was performed to evaluate the lower extremity arteries including the common, superficial and profunda femoral arteries, popliteal artery and calf arteries. COMPARISON:  None Available. FINDINGS: Short necked pseudoaneurysm anterior to the right common femoral artery. Pseudoaneurysm measures 3.4 cm diameter, overall hematoma 6.2 cm. Normal color signal in the common femoral artery. Normal venous waveform in the common femoral vein. 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 Electronically Signed   By: JONETTA Faes M.D.   On: 07/18/2023 14:01     Assessment/Plan There are no diagnoses linked to this encounter.   CORDELLA SHAWL, MD  08/12/2023 3:25 PM

## 2023-08-13 ENCOUNTER — Ambulatory Visit (INDEPENDENT_AMBULATORY_CARE_PROVIDER_SITE_OTHER): Admitting: Vascular Surgery

## 2023-08-13 ENCOUNTER — Ambulatory Visit: Admission: RE | Admit: 2023-08-13 | Source: Ambulatory Visit

## 2023-08-13 DIAGNOSIS — E782 Mixed hyperlipidemia: Secondary | ICD-10-CM

## 2023-08-13 DIAGNOSIS — I25709 Atherosclerosis of coronary artery bypass graft(s), unspecified, with unspecified angina pectoris: Secondary | ICD-10-CM

## 2023-08-13 DIAGNOSIS — T148XXA Other injury of unspecified body region, initial encounter: Secondary | ICD-10-CM

## 2023-08-13 DIAGNOSIS — I10 Essential (primary) hypertension: Secondary | ICD-10-CM
